# Patient Record
Sex: Female | Born: 1965 | Race: Asian | Hispanic: No | Marital: Married | Smoking: Former smoker
Health system: Southern US, Community
[De-identification: ages and names within clinical notes are randomized; demographics above are authoritative.]

## PROBLEM LIST (undated history)

## (undated) HISTORY — PX: BREAST SURGERY: SHX581

## (undated) HISTORY — PX: WISDOM TOOTH EXTRACTION: SHX21

## (undated) HISTORY — PX: FOOT SURGERY: SHX648

## (undated) HISTORY — PX: BUNIONECTOMY: SHX129

---

## 1998-11-07 ENCOUNTER — Ambulatory Visit (HOSPITAL_COMMUNITY): Admission: RE | Admit: 1998-11-07 | Discharge: 1998-11-07 | Payer: Self-pay

## 1998-11-07 ENCOUNTER — Encounter: Payer: Self-pay | Admitting: Gynecology

## 1998-12-12 ENCOUNTER — Ambulatory Visit (HOSPITAL_BASED_OUTPATIENT_CLINIC_OR_DEPARTMENT_OTHER): Admission: RE | Admit: 1998-12-12 | Discharge: 1998-12-12 | Payer: Self-pay | Admitting: Surgery

## 1999-11-20 ENCOUNTER — Encounter: Payer: Self-pay | Admitting: Gynecology

## 1999-11-20 ENCOUNTER — Ambulatory Visit (HOSPITAL_COMMUNITY): Admission: RE | Admit: 1999-11-20 | Discharge: 1999-11-20 | Payer: Self-pay | Admitting: Gynecology

## 2002-01-05 ENCOUNTER — Other Ambulatory Visit: Admission: RE | Admit: 2002-01-05 | Discharge: 2002-01-05 | Payer: Self-pay | Admitting: Gynecology

## 2003-03-01 ENCOUNTER — Encounter: Payer: Self-pay | Admitting: Gynecology

## 2003-03-01 ENCOUNTER — Encounter: Admission: RE | Admit: 2003-03-01 | Discharge: 2003-03-01 | Payer: Self-pay | Admitting: Gynecology

## 2004-10-29 ENCOUNTER — Other Ambulatory Visit: Admission: RE | Admit: 2004-10-29 | Discharge: 2004-10-29 | Payer: Self-pay | Admitting: Gynecology

## 2005-02-25 ENCOUNTER — Ambulatory Visit (HOSPITAL_COMMUNITY): Admission: RE | Admit: 2005-02-25 | Discharge: 2005-02-25 | Payer: Self-pay | Admitting: Gynecology

## 2005-02-25 ENCOUNTER — Encounter (INDEPENDENT_AMBULATORY_CARE_PROVIDER_SITE_OTHER): Payer: Self-pay | Admitting: Specialist

## 2005-02-25 ENCOUNTER — Ambulatory Visit (HOSPITAL_BASED_OUTPATIENT_CLINIC_OR_DEPARTMENT_OTHER): Admission: RE | Admit: 2005-02-25 | Discharge: 2005-02-25 | Payer: Self-pay | Admitting: Gynecology

## 2005-11-26 ENCOUNTER — Other Ambulatory Visit: Admission: RE | Admit: 2005-11-26 | Discharge: 2005-11-26 | Payer: Self-pay | Admitting: Gynecology

## 2005-12-10 ENCOUNTER — Encounter: Admission: RE | Admit: 2005-12-10 | Discharge: 2005-12-10 | Payer: Self-pay | Admitting: Gynecology

## 2006-01-31 ENCOUNTER — Ambulatory Visit (HOSPITAL_COMMUNITY): Admission: RE | Admit: 2006-01-31 | Discharge: 2006-01-31 | Payer: Self-pay | Admitting: Allergy

## 2006-04-21 ENCOUNTER — Emergency Department (HOSPITAL_COMMUNITY): Admission: EM | Admit: 2006-04-21 | Discharge: 2006-04-21 | Payer: Self-pay | Admitting: *Deleted

## 2006-04-23 ENCOUNTER — Emergency Department (HOSPITAL_COMMUNITY): Admission: EM | Admit: 2006-04-23 | Discharge: 2006-04-23 | Payer: Self-pay | Admitting: Emergency Medicine

## 2006-04-27 ENCOUNTER — Ambulatory Visit (HOSPITAL_COMMUNITY): Admission: RE | Admit: 2006-04-27 | Discharge: 2006-04-27 | Payer: Self-pay | Admitting: *Deleted

## 2007-02-10 ENCOUNTER — Other Ambulatory Visit: Admission: RE | Admit: 2007-02-10 | Discharge: 2007-02-10 | Payer: Self-pay | Admitting: Gynecology

## 2007-07-07 ENCOUNTER — Encounter: Admission: RE | Admit: 2007-07-07 | Discharge: 2007-07-07 | Payer: Self-pay | Admitting: Gynecology

## 2007-10-12 HISTORY — PX: ABDOMINAL HYSTERECTOMY: SHX81

## 2008-05-07 ENCOUNTER — Encounter: Payer: Self-pay | Admitting: Gynecology

## 2008-05-07 ENCOUNTER — Ambulatory Visit (HOSPITAL_BASED_OUTPATIENT_CLINIC_OR_DEPARTMENT_OTHER): Admission: RE | Admit: 2008-05-07 | Discharge: 2008-05-07 | Payer: Self-pay | Admitting: Gynecology

## 2008-06-28 ENCOUNTER — Ambulatory Visit: Payer: Self-pay | Admitting: Gynecology

## 2008-08-08 ENCOUNTER — Ambulatory Visit: Payer: Self-pay | Admitting: Gynecology

## 2008-08-08 ENCOUNTER — Encounter: Payer: Self-pay | Admitting: Gynecology

## 2008-08-08 ENCOUNTER — Other Ambulatory Visit: Admission: RE | Admit: 2008-08-08 | Discharge: 2008-08-08 | Payer: Self-pay | Admitting: Gynecology

## 2008-08-16 ENCOUNTER — Encounter: Admission: RE | Admit: 2008-08-16 | Discharge: 2008-08-16 | Payer: Self-pay | Admitting: Gynecology

## 2009-08-22 ENCOUNTER — Encounter: Payer: Self-pay | Admitting: Gynecology

## 2009-08-22 ENCOUNTER — Ambulatory Visit: Payer: Self-pay | Admitting: Gynecology

## 2009-08-22 ENCOUNTER — Other Ambulatory Visit: Admission: RE | Admit: 2009-08-22 | Discharge: 2009-08-22 | Payer: Self-pay | Admitting: Gynecology

## 2009-09-12 ENCOUNTER — Encounter: Admission: RE | Admit: 2009-09-12 | Discharge: 2009-09-12 | Payer: Self-pay | Admitting: Gynecology

## 2010-09-18 ENCOUNTER — Other Ambulatory Visit
Admission: RE | Admit: 2010-09-18 | Discharge: 2010-09-18 | Payer: Self-pay | Source: Home / Self Care | Admitting: Gynecology

## 2010-09-18 ENCOUNTER — Ambulatory Visit: Payer: Self-pay | Admitting: Gynecology

## 2010-09-25 ENCOUNTER — Ambulatory Visit: Payer: Self-pay | Admitting: Gynecology

## 2010-10-07 ENCOUNTER — Encounter
Admission: RE | Admit: 2010-10-07 | Discharge: 2010-10-07 | Payer: Self-pay | Source: Home / Self Care | Attending: Gynecology | Admitting: Gynecology

## 2011-02-23 NOTE — H&P (Signed)
NAME:  Stacey Wagner, Stacey Wagner                 ACCOUNT NO.:  [id number]   MEDICAL RECORD NO.:  [id number]          PATIENT TYPE:  AMB   LOCATION:  [facility]                         FACILITY:  [facility]   PHYSICIAN:  Timothy P. Fontaine, M.D.DATE OF BIRTH:  [birth year]   DATE OF ADMISSION:  DATE OF DISCHARGE:                              HISTORY & PHYSICAL   The patient is being admitted to [facility] May 07, 2008, 7:30 a.m.   CHIEF COMPLAINT:  Worsening menorrhagia, dysmenorrhea, leiomyoma.   HISTORY OF PRESENT ILLNESS:  A 45 year old G2, P2 female with withdraw  of birth control, worsening dysmenorrhea, menorrhagia, known myomas  which seem to be enlarging on ultrasound now measuring 61 mm displacing  the endometrial cavity and a smaller 36 mm myoma.  She is having bleed-  through episodes, cannot leave the house and does have a history of a  prior hysteroscopic myomectomy with initial relief, but now returning of  her symptoms.  The  recent sonohystogram did not show any intracavitary  abnormalities.  Options for management were reviewed with the patient  and ultimately has decided to proceed with hysterectomy.   PAST MEDICAL HISTORY:  Uncomplicated.   PAST SURGICAL HISTORY:  1. Includes bunion surgery.  2. Breast biopsy.  3. Hysteroscopic myomectomy.   CURRENT MEDICATIONS:  Allegra.   ALLERGIES:  KEFLEX and PENICILLIN lead to rash.   REVIEW OF SYSTEMS:  Noncontributory.   FAMILY HISTORY:  Noncontributory.   SOCIAL HISTORY:  Noncontributory.   PHYSICAL EXAMINATION:  VITAL SIGNS:  Stable.  Afebrile.  HEENT:  Normal.  LUNGS:  Clear.  CARDIAC:  Regular rate without rubs, murmurs or gallops.  ABDOMEN:  Benign.  PELVIC:  External BUS and vagina normal.  Cervix normal.  Uterus  globoid, retroverted.  Adnexa without gross masses or tenderness.   ASSESSMENT:  A 45 year old G2, P2 female withdraw of birth control,  worsening menorrhagia, dysmenorrhea, bleed-through episodes,  cannot  leave the house with enlarging myomas for LAVH.  Options for management  and alternatives were reviewed with her to include attempts at hormonal  suppression, myomectomy, uterine artery embolization, hysterectomy.  I  do not think ablation would be a good choice given the distortion from  the myoma.  She had trials of hormonal manipulation previously, but had  problems with migraines with trials of hormone.  After a lengthy  discussion, the patient wants to proceed with hysterectomy.  I reviewed  the absolute irreversible sterility associated with hysterectomy of  which she understands and accepts.  I reviewed ovarian conservation with  her and the options of keeping both ovaries for continued hormone  production, but also keeping the risk of benign ovarian disease  requiring reoperation, pain, problems in the future as well as the risk  of ovarian cancer versus removing her ovaries, the issues of  hypoestrogenism and the issues with hormone replacement therapy, WHI  study, possible increased risk of breast cancer, stroke, heart attack,  DVT issues.   The patient wants to keep both ovaries, although she does give me  permission to remove one or both  ovaries if significant disease is  encountered at the time of the surgery.  She understands by keeping both  ovaries that she is at risk for ovarian cancer and benign ovarian  disease in the future.  Sexuality following hysterectomy was also  reviewed.  The potential for persistent dyspareunia as well as orgasmic  dysfunction following the hysterectomy was discussed, understood and  accepted.  The acute intraoperative and  postoperative risks were  reviewed with the patient to include the issues of anesthetic risks,  perioperative complications as well as risk of infection either internal  requiring prolonged antibiotics, abscess formation or hematoma formation  requiring reoperation, abscess/hematoma drainage, wound complications   requiring opening and draining of incisions, closure by secondary  intention.  The patient understands at any time during the procedure, I  may convert the laparoscopic approach to an abdominal approach with a  larger incision.  If either complications arise or it is felt unsafe to  proceed with the vaginal approach, she understands and accepts this.  The long-term issues as far as cosmetics as well as hernia formation  with incision sites were also discussed.  The risk of hemorrhage  necessitating transfusion.  The risks of transfusion were reviewed to  include transfusion reaction, hepatitis, HIV, mad cow disease and other  unknown entities.  The patient initially had thought about autologous  blood, but decided against this and accepts the risks of transfusion.  The risks of acute injury to internal organs including bowel, bladder,  ureters, vessels and nerves immediately recognized, delay recognized  necessitating major exploratory reparative surgeries, future reparative  surgeries, ostomy formation, bowel resection, bladder repair, ureteral  damage repair was all discussed, understood and accepted.  The patient's  questions were answered to her satisfaction and she is ready to proceed  with surgery.      Timothy P. Fontaine, M.D.  Electronically Signed     TPF/MEDQ  D:  04/19/2008  T:  04/19/2008  Job:  [id number]

## 2011-02-23 NOTE — Op Note (Signed)
NAME:  Stacey Wagner, Stacey Wagner                 ACCOUNT NO.:  [id number]   MEDICAL RECORD NO.:  [id number]          PATIENT TYPE:  AMB   LOCATION:  [facility]                         FACILITY:  [facility]   PHYSICIAN:  Timothy P. Fontaine, M.D.DATE OF BIRTH:  03/21/1966   DATE OF PROCEDURE:  05/07/2008  DATE OF DISCHARGE:                               OPERATIVE REPORT   PREOPERATIVE DIAGNOSES:  Increasing menorrhagia, dysmenorrhea,  leiomyoma.   POSTOPERATIVE DIAGNOSES:  Increasing menorrhagia, dysmenorrhea,  leiomyoma.   PROCEDURE:  Laparoscopic-assisted vaginal hysterectomy.   SURGEON:  Timothy P. Fontaine, M.D.   ASSISTANTGaetano Hawthorne. Lily Peer, M.D.   ANESTHETIC:  General, with 2% Marcaine incisional injection.   SPECIMEN:  Uterus to pathology.   COMPLICATIONS:  None.   ESTIMATED BLOOD LOSS:  75 mL.   INTRAOPERATIVE URINE OUTPUT:  100 mL.   FINDINGS:  EUA:  External BUS, vagina normal.  Cervix grossly normal.  Uterus globoid, mildly enlarged, midline, and mobile.  Adnexa without  gross masses.  Laparoscopic:  Anterior cul-de-sac normal.  Posterior cul-  de-sac normal.  Uterus enlarged, globoid, consistent with leiomyoma.  Right and left ovaries grossly normal.  Right and left fallopian tubes  grossly normal, free and mobile.  No evidence of pelvic adhesive disease  or endometriosis.  Upper abdominal exam is grossly normal.  Liver  smooth, no abnormalities.  Gallbladder not visualized.  Appendix not  visualized.   PROCEDURE:  The patient was taken to the operating room, underwent  general anesthesia, and was placed low dorsal lithotomy position.  Received an abdominal, perineal, and vaginal preparation with Betadine  solution.  Bladder emptied with indwelling Foley catheterization, placed  in sterile technique.  EUA performed.  Hulka tenaculum placed in the  cervix.  The patient was draped in the usual fashion.  A vertical  infraumbilical incision was made.  The 10 mm OptiVu type  laparoscopic  trocar was placed under direct visualization without difficulty, and the  abdomen was insufflated.  Examination of the pelvic organs, upper  abdominal exam was carried out, with findings noted above.  Right and  left 5 mm suprapubic ports were then placed under direct visualization  after transillumination of the vessels without difficulty.  Using the  harmonic scalpel, the right pelvic adnexa was examined, the sidewall  inspected, the ureter found to be away from the operative site, and the  right uteroovarian pedicle was then transected using the harmonic  scalpel without difficulty.  The remaining broad ligament and ultimately  the round ligament was transected using the harmonic scalpel, and the  anterior vesicouterine peritoneal fold was then transected to the  midline, again using the harmonic scalpel.  A similar procedure was then  carried out on the other side.  Attention was then turned to the vaginal  portion of the case.  The patient was placed in the high dorsal  lithotomy position.  Proper placement was checked.  The Hulka tenaculum  was removed.  Cervix visualized with a weighted speculum, grasped with a  tenaculum, and the cervical mucosa was circumferentially injected using  1% lidocaine with  epinephrine mixture, a total of 9 mL.  The cervical  mucosa was then sharply incised circumferentially, and the paracervical  planes were sharply developed.  The posterior cul-de-sac was then  sharply entered.  A long weighted speculum was placed.  The right  uterosacral ligament was identified, clamped, cut, and ligated using 0  Vicryl suture and tagged for future reference.  A similar procedure was  carried out on the other side.  The anterior vesicouterine plane was  then sharply developed, and ultimately the anterior cul-de-sac entered.  The uterus was then progressively freed from its attachments through  clamping, cutting, and ligating of the parametrial cardinal  ligaments,  and ultimately the uterine vessels, all using 0 Vicryl suture.  Due to  the bulk of the uterus, it was determined that morcellation was most  prudent, and at this point the cervix was circumferentially excised from  the uterus, and the uterus was progressively morcellated to ultimately  delivered through the vagina.  All remaining parametrial attachments  were clamped, cut, and ligated using 0 Vicryl suture, and the uterus was  completely removed.  The long weighted speculum was replaced with a  shorter weighted speculum, posterior vaginal cuff grasped with an Allis  clamp.  A moist tail sponge placed to visualize the posterior cul-de-  sac.  The pelvis irrigated, and adequate hemostasis was visualized.  The  posterior vaginal cuff was then run from uterosacral ligament to  uterosacral ligament using 0 Vicryl suture in a running-interlocking  stitch.  The tail sponge was removed, and the vagina was then closed  anterior to posterior using 0 Vicryl suture in interrupted figure-of-  eight stitch.  The vagina was irrigated.  Hemostasis was visualized.  Clear yellow urine was noted.  At this point, attention was then  returned to the abdominal portion.  The patient was placed in low dorsal  lithotomy position.  The surgeon, assistant, and scrub regloved, and the  abdomen was reinsufflated.  The abdomen was copiously irrigated.  The  gas was slowly allowed to escape.  Several small bleeding points along  the vesicovaginal site were bipolar cauterized, and ultimately the  suprapubic ports were removed under direct visualization, showing  adequate hemostasis.  The infraumbilical port was then backed out under  direct visualization, showing adequate hemostasis.  No evidence of  hernia formation.  Two percent Marcaine was injected at all incision  sites.  0 Vicryl subcutaneous fascial stitch was placed  infraumbilically, and all skin incisions closed using 4-0 plain suture  in  interrupted cuticular stitch.  Sterile dressings were applied.  The  patient was placed in the supine position, awakened without difficulty,  and taken to the recovery room in good condition, having tolerated the  procedure well.      Timothy P. Fontaine, M.D.  Electronically Signed     TPF/MEDQ  D:  05/07/2008  T:  05/07/2008  Job:  [id number]

## 2011-02-26 NOTE — H&P (Signed)
NAME:  Stacey Wagner, Stacey Wagner                 ACCOUNT NO.:  [id number]   MEDICAL RECORD NO.:  [id number]          PATIENT TYPE:  AMB   LOCATION:  [facility]                         FACILITY:  [facility]   PHYSICIAN:  Timothy P. Fontaine, M.D.DATE OF BIRTH:  [birth year]   DATE OF ADMISSION:  DATE OF DISCHARGE:                                HISTORY & PHYSICAL   DATE OF SURGERY:  Feb 25, 2005 at 7:30 at [facility].   CHIEF COMPLAINT:  Menorrhagia.   HISTORY OF PRESENT ILLNESS:  A 45 year old G2, P2 female, barrier  contraception with history of increasing menorrhagia. Outpatient evaluation  included normal thyroid function and normal hemoglobin of 13.  Sonohysterogram was performed. The patient was found to have a 4.5 to 5 cm  submucosal myoma with protrusion of 1/3 to 1/2 of the myoma into the cavity.  The patient is admitted at this time for hysteroscopic evaluation and  resection.   PAST MEDICAL HISTORY:  Uncomplicated.   PAST SURGICAL HISTORY:  Includes foot surgery, breast biopsy, bunion  surgery.   ALLERGIES:  PENICILLIN.   REVIEW OF SYSTEMS:  Noncontributory.   FAMILY HISTORY:  Noncontributory.   CURRENT MEDICATIONS:  None.   PHYSICAL EXAMINATION:  VITAL SIGNS:  Stable vital signs. Afebrile.  HEENT:  Normal.  LUNGS:  Clear.  CARDIAC:  Regular rate. No murmur, rub, or gallop.  ABDOMEN:  Examination benign.  PELVIC:  External BUS vagina normal. Cervix grossly normal. Uterus overall  normal in size. Midline immobile. Adnexa without masses or tenderness.   ASSESSMENT/PLAN:  A 45 year old G2, P2 female, increasing menorrhagia,  becoming life altering to her. Ultrasound shows 2 myomas, one intramural,  measuring 39 mm and 1 submucosal, measuring 45 to 50 mm. Approximately 1/2  to 1/3 of the myoma protrudes into the cavity. I reviewed with the patient  the situations and this issues and options to include observation,  hysteroscopic resection, suppression, i.e. oral  contraceptives Depo-Lupron,  Depo-Provera, and ultimately hysterectomy. I discussed with her what is  involved with the hysteroscopic approach to include the expected  intraoperative, postoperative courses and the use of the hysteroscope. She  understands that I can only resect that portion of the myoma that protrudes  into the cavity and there is a good chance that I will not be able to remove  all of the myoma but hopefully, if we resect some of it, this may help her  with her menorrhagia. The risks of infection, bleeding, transfusion, uterine  perforation, damage to internal organs including bowel, bladder, ureters,  vessels and nerves and necessitating major exploratory reparative surgeries  and future reparative surgeries including ostomy formation was all  discussed, understood and accepted. I also reviewed the issues of distended  media absorption and the risks of metabolic complications including coma,  seizures. She understands that we may need to abort the case if too much  absorption occurs during the procedure and she understands and accepts this.  Again, she has a realistic understanding that we may not be able to resect  all of it and in fact, probably will  not be able to resect all of it but  hopefully resecting portions of it will help with her menorrhagia. The  patient questions are answered to her satisfaction and she is ready to  proceed with surgery.      TPF/MEDQ  D:  02/22/2005  T:  02/22/2005  Job:  [id number]

## 2011-02-26 NOTE — Op Note (Signed)
NAME:  BETTEY, MURAOKA                 ACCOUNT NO.:  [id number]   MEDICAL RECORD NO.:  [id number]          PATIENT TYPE:  AMB   LOCATION:  [facility]                         FACILITY:  [facility]   PHYSICIAN:  Timothy P. Fontaine, M.D.DATE OF BIRTH:  05-26-1966   DATE OF PROCEDURE:  02/25/2005  DATE OF DISCHARGE:                                 OPERATIVE REPORT   PREOPERATIVE DIAGNOSES:  Submucous myoma.   POSTOPERATIVE DIAGNOSES:  Submucous myoma.   PROCEDURE:  Hysteroscopic submucous myomectomy.   SURGEON:  Timothy P. Fontaine, M.D.   ANESTHETIC:  General.   ESTIMATED BLOOD LOSS:  Minimal.   COMPLICATIONS:  None.   SORBITOL DISCREPANCY:  295 mL.   SPECIMEN:  Endometrial curetting, myoma fragments.   FINDINGS:  Large anterior submucous myoma estimate 90% removed, cavity  otherwise grossly normal. Tubal ostia not visualized, lower uterine segment  endocervical canal all visualized and normal.   DESCRIPTION OF PROCEDURE:  The patient was taken to the operating room,  underwent general anesthesia and was placed in the dorsal lithotomy  position, received a perineal vaginal preparation with Betadine solution.  Bladder emptied with in and out Foley catheterization. EUA performed. The  patient was draped in the usual fashion. A weighted speculum was placed,  anterior lip of the cervix grasped with a single-tooth tenaculum and the  cervix was gently and gradually dilated to admit the operative hysteroscope.  Hysteroscopy was performed with findings noted above.  Using the right-angle  resectoscopic loop, the submucous myoma was resected through repetitive  passes noting decompression with removal of fragments allowed more of the  myoma to protrude into the cavity for greater resection. At the end of the  procedure, it was estimated a minimal amount, approximately 10%  appeared to  be remaining at the level of the surrounding endometrium and this was not  removed for fear of subsequent  perforation. Sharp curettage was then  performed, rehysteroscopy performed, good distension, no evidence of  perforation. Instruments were removed. Initial bleeding from tenaculum sites  were noted, silver nitrate was applied and  ultimately two 3-0 chromic interrupted sutures were placed at both tenaculum  sites to achieve ultimate hemostasis. The patient was placed in supine  position, awakened without difficulty and taken to the recovery room in good  condition having tolerated the procedure well.      TPF/MEDQ  D:  02/25/2005  T:  02/25/2005  Job:  [id number]

## 2011-02-26 NOTE — Discharge Summary (Signed)
NAME:  VANGIE, HENTHORN                 ACCOUNT NO.:  [id number]   MEDICAL RECORD NO.:  [id number]          PATIENT TYPE:  AMB   LOCATION:  [facility]                         FACILITY:  [facility]   PHYSICIAN:  Timothy P. Fontaine, M.D.DATE OF BIRTH:  09/25/1966   DATE OF ADMISSION:  05/07/2008  DATE OF DISCHARGE:  05/08/2008                               DISCHARGE SUMMARY   DISCHARGE DIAGNOSES:  1. Dysmenorrhea.  2. Menorrhagia.  3. Leiomyoma.   PROCEDURE:  LAVH May 07, 2008, pathology pending.   HOSPITAL COURSE:  A 45 year old underwent uncomplicated LAVH May 07, 2008 with findings of leiomyoma.  The patient's postoperative course was  uncomplicated.  She was discharged on postoperative day number 1  ambulating well, tolerating a regular diet, voiding without difficulty  with postoperative hemoglobin of 10.5.  The patient did have itching  when taking oral Tylox and was switched to Dilaudid 2 mg tablets and did  well with this, and was sent home with a prescription of Dilaudid 2 mg  tablets, number 15 one-two p.o. q.4-6 h. p.r.n. pain.  She was given  precautions, instructions, followup and will be seen in the office 2  weeks following discharge.      Timothy P. Fontaine, M.D.  Electronically Signed     TPF/MEDQ  D:  05/08/2008  T:  05/08/2008  Job:  [id number]

## 2011-07-09 LAB — CBC
HCT: 31.6 — ABNORMAL LOW
MCV: 91.3
RBC: 3.46 — ABNORMAL LOW
RDW: 16.2 — ABNORMAL HIGH

## 2011-08-13 ENCOUNTER — Other Ambulatory Visit: Payer: Self-pay | Admitting: Family Medicine

## 2011-08-13 ENCOUNTER — Ambulatory Visit
Admission: RE | Admit: 2011-08-13 | Discharge: 2011-08-13 | Disposition: A | Discharge status: Home / Self Care | Payer: BC Managed Care – PPO | Source: Ambulatory Visit | Attending: Family Medicine | Admitting: Family Medicine

## 2011-08-13 DIAGNOSIS — E041 Nontoxic single thyroid nodule: Secondary | ICD-10-CM

## 2011-08-13 DIAGNOSIS — R221 Localized swelling, mass and lump, neck: Secondary | ICD-10-CM

## 2011-08-16 ENCOUNTER — Other Ambulatory Visit: Payer: Self-pay | Admitting: Family Medicine

## 2011-08-16 DIAGNOSIS — E041 Nontoxic single thyroid nodule: Secondary | ICD-10-CM

## 2011-08-18 ENCOUNTER — Other Ambulatory Visit (HOSPITAL_COMMUNITY)
Admission: RE | Admit: 2011-08-18 | Discharge: 2011-08-18 | Disposition: A | Discharge status: Home / Self Care | Payer: BC Managed Care – PPO | Source: Ambulatory Visit | Attending: Diagnostic Radiology | Admitting: Diagnostic Radiology

## 2011-08-18 ENCOUNTER — Ambulatory Visit
Admission: RE | Admit: 2011-08-18 | Discharge: 2011-08-18 | Disposition: A | Discharge status: Home / Self Care | Payer: BC Managed Care – PPO | Source: Ambulatory Visit | Attending: Family Medicine | Admitting: Family Medicine

## 2011-08-18 DIAGNOSIS — E041 Nontoxic single thyroid nodule: Secondary | ICD-10-CM

## 2011-08-18 DIAGNOSIS — E049 Nontoxic goiter, unspecified: Secondary | ICD-10-CM | POA: Insufficient documentation

## 2011-08-20 ENCOUNTER — Other Ambulatory Visit: Payer: Self-pay

## 2011-09-17 ENCOUNTER — Ambulatory Visit (INDEPENDENT_AMBULATORY_CARE_PROVIDER_SITE_OTHER): Payer: BC Managed Care – PPO | Admitting: Surgery

## 2011-09-17 ENCOUNTER — Encounter (INDEPENDENT_AMBULATORY_CARE_PROVIDER_SITE_OTHER): Payer: Self-pay | Admitting: Surgery

## 2011-09-17 VITALS — BP 128/84 | HR 60 | Temp 98.0°F | Resp 16 | Ht 61.5 in | Wt 140.0 lb

## 2011-09-17 DIAGNOSIS — E079 Disorder of thyroid, unspecified: Secondary | ICD-10-CM

## 2011-09-17 DIAGNOSIS — E049 Nontoxic goiter, unspecified: Secondary | ICD-10-CM

## 2011-09-17 NOTE — Patient Instructions (Signed)
Followup  if mass recurs in the thyroid lobe Would use iodinated salt--since you developed this goiter while off iodinated salt

## 2011-09-17 NOTE — Progress Notes (Signed)
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 She noticed a lump in her left neck and saw Dr. Wynelle Link. Dr. Wynelle Link ordered an ultrasound of the thyroid or a cystic structure was seen and aspirated area. Aspiration caused the mass to go away and discomfort to go away as well. The cytology of this was that of a non-neoplastic goiter.  On today's exam there is no residual mass felt.  History reviewed. No pertinent past medical history.  Past Surgical History  Procedure Date  . Breast surgery     cyst removed from left breast  . Bunionectomy 2002  . Wisdom tooth extraction   . Abdominal hysterectomy 2009    partial hysterectomy     No current outpatient prescriptions on file as of 09/17/2011.   No current facility-administered medications on file as of 09/17/2011.   Allergies  Allergen Reactions  . Keflex     rash and hives  . Penicillins     Rash and hives    History reviewed. No pertinent family history. Social History:   reports that she has quit smoking. She has never used smokeless tobacco. She reports that she drinks alcohol. She reports that she does not use illicit drugs.   REVIEW OF SYSTEMS - PERTINENT POSITIVES ONLY: She does have a son with lymphoma. She has recently been not taking iodinated salt.  Physical Exam:   Blood pressure 128/84, pulse 60, temperature 98 F (36.7 C), temperature source Temporal, resp. rate 16, height 5' 1.5" (1.562 m), weight 140 lb (63.504 kg). Body mass index is 26.02 kg/(m^2).  Gen:  No acute distress.  Well nourished and well groomed.   Neurological: Alert and oriented to person, place, and time. Coordination normal.  Head: Normocephalic and atraumatic.  Eyes: Conjunctivae are normal. Pupils are equal, round, and reactive to light. No scleral icterus.  Neck: Normal range of motion. Neck supple. No  tracheal deviation or thyromegaly present.  Cardiovascular:  SR without murmurs or gallops Respiratory: Effort normal.  No respiratory distress. No chest wall tenderness. Breath sounds normal.  No wheezes, rales or rhonchi.  GI: Soft. Bowel sounds are normal. The abdomen is soft and nontender.  There is no rebound and no guarding. GU:  No inguinal herniae Musculoskeletal: Normal range of motion. Extremities are nontender.  Lymphadenopathy: No cervical, preauricular, postauricular or axillary adenopathy is present Skin: Skin is warm and dry. No rash noted. No diaphoresis. No erythema. No pallor. No clubbing, cyanosis, or edema.  Pscyh: Normal mood and affect. Behavior is normal. Judgment and thought content normal.   LABORATORY RESULTS: No results found for this or any previous visit (from the past 48 hour(s)).  RADIOLOGY RESULTS: No results found.  Problem List: Active Problems:  * No active hospital problems. 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 I discussed management options with her and I think it's very reasonable just to watch physical exam. If she has a recurrent mass within her thyroid I would repeat her over sound and reaspirated but with a lower threshold for recommending thyroidectomy. I told her that we'll be happy to see her again if needed or she could certainly follow up with Dr. Wynelle Link as needed.    Matt B. Daphine Deutscher, MD, [facility]  [facility] [phone] beeper [phone]  09/17/2011 10:33 AM

## 2011-09-21 ENCOUNTER — Encounter (INDEPENDENT_AMBULATORY_CARE_PROVIDER_SITE_OTHER): Payer: Self-pay | Admitting: Surgery

## 2013-07-27 ENCOUNTER — Encounter: Payer: Self-pay | Admitting: Gynecology

## 2013-07-27 ENCOUNTER — Ambulatory Visit (INDEPENDENT_AMBULATORY_CARE_PROVIDER_SITE_OTHER): Payer: BC Managed Care – PPO | Admitting: Gynecology

## 2013-07-27 ENCOUNTER — Other Ambulatory Visit (HOSPITAL_COMMUNITY)
Admission: RE | Admit: 2013-07-27 | Discharge: 2013-07-27 | Disposition: A | Discharge status: Home / Self Care | Payer: BC Managed Care – PPO | Source: Ambulatory Visit | Attending: Gynecology | Admitting: Gynecology

## 2013-07-27 VITALS — BP 116/74 | Ht 62.0 in | Wt 140.0 lb

## 2013-07-27 DIAGNOSIS — Z01419 Encounter for gynecological examination (general) (routine) without abnormal findings: Secondary | ICD-10-CM | POA: Insufficient documentation

## 2013-07-27 DIAGNOSIS — R300 Dysuria: Secondary | ICD-10-CM

## 2013-07-27 DIAGNOSIS — N951 Menopausal and female climacteric states: Secondary | ICD-10-CM

## 2013-07-27 LAB — WET PREP FOR TRICH, YEAST, CLUE
Clue Cells Wet Prep HPF POC: NONE SEEN
Trich, Wet Prep: NONE SEEN
WBC, Wet Prep HPF POC: NONE SEEN

## 2013-07-27 LAB — URINALYSIS W MICROSCOPIC + REFLEX CULTURE
Bilirubin Urine: NEGATIVE
Specific Gravity, Urine: <1.005 — ABNORMAL LOW (ref 1.005–1.030)

## 2013-07-27 LAB — TSH: TSH: 1.66 u[IU]/mL (ref 0.350–4.500)

## 2013-07-27 MED ORDER — FLUCONAZOLE 150 MG PO TABS: 150.0000 mg | ORAL_TABLET | Freq: Once | ORAL | Status: DC

## 2013-07-27 NOTE — Addendum Note (Signed)
Addended by: Dayna Barker on: 07/27/2013 10:55 AM   Modules accepted: Orders

## 2013-07-27 NOTE — Progress Notes (Addendum)
Stacey Wagner 14-Dec-1965 [id number]        47 y.o.  G2P2002 for annual exam.  Several issues that are below.  Past medical history,surgical history, medications, allergies, family history and social history were all reviewed and documented in the EPIC chart.  ROS:  Performed and pertinent positives and negatives are included in the history, assessment and plan .  Exam: Kim assistant Filed Vitals:   07/27/13 0944  BP: 116/74  Height: 5\' 2"  (1.575 m)  Weight: 140 lb (63.504 kg)   General appearance  Normal Skin grossly normal Head/Neck normal with no cervical or supraclavicular adenopathy thyroid normal Lungs  clear Cardiac RR, without RMG Abdominal  soft, nontender, without masses, organomegaly or hernia Breasts  examined lying and sitting without masses, retractions, discharge or axillary adenopathy. Pelvic  Ext/BUS/vagina  slight white discharge, Pap of cuff done  Adnexa  Without masses or tenderness    Anus and perineum  normal   Rectovaginal  normal sphincter tone without palpated masses or tenderness.    Assessment/Plan:  47 y.o. [id number] female for annual exam.   1. 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 Is starting to have hot flashes and night sweats.  Notes that her mother and sister both went into menopause in her 46s. Will check TSH FSH.  I reviewed the whole issue of HRT with her to include the WHI study with increased risk of stroke, heart attack, DVT and breast cancer. The ACOG and NAMS statements for lowest dose for the shortest period of time reviewed. Transdermal versus oral first-pass effect benefit discussed. Patient will think about whether she wants to start assuming that her [facility] is elevated. Will followup for lab results. 2. Mild dysuria. Exam and wet prep consistent with yeast vaginitis. Urinalysis is negative. Will cover with Diflucan 150x1 dose. Followup if symptoms persist. 3. Pap smear 2011. No history of significant Pap smears previously. Discussed current  screening guidelines. She is status post hysterectomy for benign indications and options to stop altogether discussed. I did a Pap smear today and we'll rediscuss on an annual basis. 4. Mammography 2011. Patient knows to schedule and agrees to do so. SBE monthly reviewed. 5. Health maintenance. Patient reports routine blood work done earlier this year. Followup for lab results otherwise annually.   Note: This document was prepared with digital dictation and possible smart phrase technology. Any transcriptional errors that result from this process are unintentional.   Dara Lords MD, 10:10 AM 07/27/2013

## 2013-07-27 NOTE — Patient Instructions (Addendum)
Take Diflucan pill. Followup if the symptoms continue.  Call to Schedule your mammogram  Facilities in [place]: 1)  [facility] [place], [place] Phone: [phone] 2)  [facility], [phone] Phone: [phone] 3)  Dr. Yolanda Bonine at [facility] [address] Phone: [phone]     Mammogram A mammogram is an X-ray test to find changes in a woman's breast. You should get a mammogram if:  You are 70 years of age or older  You have risk factors.   Your doctor recommends that you have one.  BEFORE THE TEST  Do not schedule the test the week before your period, especially if your breasts are sore during this time.  On the day of your mammogram:  Wash your breasts and armpits well. After washing, do not put on any deodorant or talcum powder on until after your test.   Eat and drink as you usually do.   Take your medicines as usual.   If you are diabetic and take insulin, make sure you:   Eat before coming for your test.   Take your insulin as usual.   If you cannot keep your appointment, call before the appointment to cancel. Schedule another appointment.  TEST  You will need to undress from the waist up. You will put on a hospital gown.   Your breast will be put on the mammogram machine, and it will press firmly on your breast with a piece of plastic called a compression paddle. This will make your breast flatter so that the machine can X-ray all parts of your breast.   Both breasts will be X-rayed. Each breast will be X-rayed from above and from the side. An X-ray might need to be taken again if the picture is not good enough.   The mammogram will last about 15 to 30 minutes.  AFTER THE TEST Finding out the results of your test Ask when your test results will be ready. Make sure you get your test results.  Document Released: 12/24/2008 Document Revised:  09/16/2011 Document Reviewed: 12/24/2008 [facility] Patient Information 2012 [place], [place].

## 2013-07-28 LAB — FOLLICLE STIMULATING HORMONE: FSH: 27.7 m[IU]/mL

## 2013-08-02 ENCOUNTER — Telehealth: Payer: Self-pay | Admitting: *Deleted

## 2013-08-02 NOTE — Telephone Encounter (Signed)
Entered in error

## 2013-08-02 NOTE — Telephone Encounter (Signed)
Pt states that she has an appt for a 2nd opinion, prior to her appt to discuss with Dr Ralene Cork her 09/28/2013 surgery.  Pt states that she sent a records release and would like x-ray copies.  I said that I would inform Dr Ralene Cork and advise our media clerk of the records request.

## 2013-08-31 ENCOUNTER — Ambulatory Visit (INDEPENDENT_AMBULATORY_CARE_PROVIDER_SITE_OTHER): Payer: BC Managed Care – PPO

## 2013-08-31 VITALS — BP 138/87 | HR 64 | Resp 18

## 2013-08-31 DIAGNOSIS — M2040 Other hammer toe(s) (acquired), unspecified foot: Secondary | ICD-10-CM

## 2013-08-31 DIAGNOSIS — M79609 Pain in unspecified limb: Secondary | ICD-10-CM

## 2013-08-31 DIAGNOSIS — M2030 Hallux varus (acquired), unspecified foot: Secondary | ICD-10-CM

## 2013-08-31 DIAGNOSIS — M216X9 Other acquired deformities of unspecified foot: Secondary | ICD-10-CM

## 2013-08-31 DIAGNOSIS — M2042 Other hammer toe(s) (acquired), left foot: Secondary | ICD-10-CM

## 2013-08-31 DIAGNOSIS — M7750 Other enthesopathy of unspecified foot: Secondary | ICD-10-CM

## 2013-08-31 NOTE — Patient Instructions (Signed)
Pre-Operative Instructions  Congratulations, you have decided to take an important step to improving your quality of life.  You can be assured that the doctors of [facility] will be with you every step of the way.  1. Plan to be at the surgery center/hospital at least 1 (one) hour prior to your scheduled time unless otherwise directed by the surgical center/hospital staff.  You must have a responsible adult accompany you, remain during the surgery and drive you home.  Make sure you have directions to the surgical center/hospital and know how to get there on time. 2. For hospital based surgery you will need to obtain a history and physical form from your family physician within 1 month prior to the date of surgery- we will give you a form for you primary physician.  3. We make every effort to accommodate the date you request for surgery.  There are however, times where surgery dates or times have to be moved.  We will contact you as soon as possible if a change in schedule is required.   4. No Aspirin/Ibuprofen for one week before surgery.  If you are on aspirin, any non-steroidal anti-inflammatory medications (Mobic, Aleve, Ibuprofen) you should stop taking it 7 days prior to your surgery.  You make take Tylenol  For pain prior to surgery.  5. Medications- If you are taking daily heart and blood pressure medications, seizure, reflux, allergy, asthma, anxiety, pain or diabetes medications, make sure the surgery center/hospital is aware before the day of surgery so they may notify you which medications to take or avoid the day of surgery. 6. No food or drink after midnight the night before surgery unless directed otherwise by surgical center/hospital staff. 7. No alcoholic beverages 24 hours prior to surgery.  No smoking 24 hours prior to or 24 hours after surgery. 8. Wear loose pants or shorts- loose enough to fit over bandages, boots, and casts. 9. No slip on shoes, sneakers are best. 10. Bring  your boot with you to the surgery center/hospital.  Also bring crutches or a walker if your physician has prescribed it for you.  If you do not have this equipment, it will be provided for you after surgery. 11. If you have not been contracted by the surgery center/hospital by the day before your surgery, call to confirm the date and time of your surgery. 12. Leave-time from work may vary depending on the type of surgery you have.  Appropriate arrangements should be made prior to surgery with your employer. 13. Prescriptions will be provided immediately following surgery by your doctor.  Have these filled as soon as possible after surgery and take the medication as directed. 14. Remove nail polish on the operative foot. 15. Wash the night before surgery.  The night before surgery wash the foot and leg well with the antibacterial soap provided and water paying special attention to beneath the toenails and in between the toes.  Rinse thoroughly with water and dry well with a towel.  Perform this wash unless told not to do so by your physician.  Enclosed: 1 Ice pack (please put in freezer the night before surgery)   1 Hibiclens skin cleaner   Pre-op Instructions  If you have any questions regarding the instructions, do not hesitate to call our office.  [place]: [address] [place], [place] [address] [phone]  [place]: [address] [place], [place] [address] [phone]  [place]: [address]  [place], [place] [address] [phone]  Dr. Kejuan Bekker   Tuchman DPM, Dr. Norman Regal DPM Dr. Ebonie Westerlund DPM, Dr. M. Todd Hyatt DPM, Dr. Kathryn Egerton DPM 

## 2013-08-31 NOTE — Progress Notes (Signed)
  Subjective:    Patient ID: Stacey Wagner, female    DOB: Sep 18, 1966, 47 y.o.   MRN: [id number]  HPIleft foot has some new pain and i do get keloids and that was not taken care of at the surgery and the 5th toe underneath is hurting and burns in the heel and ball of foot Patient did go for a second opinion with Dr. Ria Bush in [place]. She's back in discussed the surgery as previously proposed second osteotomy and capsulotomy of second MTP joint as well as hallux varus repair with reverse Eliberto Ivory and tight rope type reconstruction of the lateral collateral ligaments of the first MTP joint. Surgery scheduled for December 19.  Review of Systems  Constitutional: Negative.   HENT: Negative.   Eyes: Negative.   Respiratory:       Cold   Cardiovascular: Negative.   Gastrointestinal: Negative.   Endocrine: Negative.   Genitourinary: Negative.   Musculoskeletal: Negative.   Skin: Negative.   Allergic/Immunologic: Positive for environmental allergies.  Neurological: Negative.   Hematological: Negative.   Psychiatric/Behavioral: Negative.        Objective:   Physical Exam Neurovascular status is intact. There is good flexibility of the first and second MTP joints again instability the toes with hallux varus and second toe varus are noted. Patient is having pain along the fifth MTP area likely secondary to compensatory gait changes and the fact that she's having pain in the big toe joints she is likely walking on the outside of her foot. X-rays reveal no sign of fracture or other osseous abnormality slight tailor bunion is identified although not significant. No fractures noted. At this time again discussed the procedure a reverse Austin-type osteotomy and utilizing an bone anchor and tight rope type technique to reconstruct the lateral MTP joint ligaments. Patient will be in her fracture boot for approximately a month likely ambulatory postop. 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     Assessment & Plan:  Assessment no new changes the second opinion 10 with the need for surgery to address the hallux varus and second toe capsulitis. The short and decompression osteotomy should be helpful as well as the realignment of the hallux. At this time surgery proceeded scheduled no additional questions by patient at this time. Patient is to contact me if she has any further questions or concerns. Patient was advised she still has some pain at the base of first metatarsal which is most likely some mild arthropathy below that fusion site is successful. I advised her not all her pain will be resolved with the surgery. Some arthritic pain in symptomology may remain despite surgical correction. She seemed understand that after explaining.  Alvan Dame DPM

## 2013-09-21 ENCOUNTER — Encounter: Payer: Self-pay | Admitting: *Deleted

## 2013-09-24 ENCOUNTER — Telehealth: Payer: Self-pay | Admitting: *Deleted

## 2013-09-24 NOTE — Telephone Encounter (Signed)
Pt asked if should bring the old boot to the surgery center, and if she could discuss possible procedure techniques with Dr Ralene Cork before they anesthestized her.  I told pt to bring the boot, in case she could use it, and to schedule an appt to discuss her procedure concerns and change her post-op appt.  Transferred to scheduler.

## 2013-09-26 ENCOUNTER — Ambulatory Visit (INDEPENDENT_AMBULATORY_CARE_PROVIDER_SITE_OTHER): Payer: BC Managed Care – PPO

## 2013-09-26 VITALS — BP 130/84 | HR 60 | Resp 12

## 2013-09-26 DIAGNOSIS — M2042 Other hammer toe(s) (acquired), left foot: Secondary | ICD-10-CM

## 2013-09-26 DIAGNOSIS — M7750 Other enthesopathy of unspecified foot: Secondary | ICD-10-CM

## 2013-09-26 DIAGNOSIS — M2030 Hallux varus (acquired), unspecified foot: Secondary | ICD-10-CM

## 2013-09-26 DIAGNOSIS — M2040 Other hammer toe(s) (acquired), unspecified foot: Secondary | ICD-10-CM

## 2013-09-26 NOTE — Progress Notes (Signed)
   Subjective:    Patient ID: Stacey Wagner, female    DOB: 06-06-1966, 47 y.o.   MRN: [id number]  HPI Comments: LT FOOT PAIN ( SURGERY CONSULT)  Foot Pain The current episode started more than 1 month ago. The problem occurs constantly. The symptoms are aggravated by walking and standing. She has tried nothing for the symptoms. The treatment provided no relief.      Review of Systems  All other systems reviewed and are negative.       Objective:   Physical Exam Neurovascular status unremarkable patient is here with additional questions bilateral posterior to for this coming Friday. Patient did have hallux varus which needs to be repaired she scheduled for a reverse Austin and reconstruction of lateral collateral ligaments utilizing a Mytec or bone anchor system. Patient also second metatarsal osteotomy for a long second met with plantarflexed metatarsal. We did discuss the postoperative course all questions asked by the patient this time are answered. Patient is anxious about having to have a repeat surgery which is certainly understandable.       Assessment & Plan:  Patient has hallux varus as well as a capsulitis second MTP area with digital contracture. Is on left foot patient has additional preoperative questions concerning the proposed surgery and postop course we discussed these in detail the consent for was again reviewed. Surgery scheduled for this Friday at the [facility] specialty surgical center. Plan is for [facility] bunionectomy are osteotomy reversed with ligament repair reconstruction utilizing bone anchor system of left great toe joint . Loss of the second met osteotomy in for correction of contracted second toe. Patient seem to be pleased with the discussion to be more comfortable with the proposed procedures at this time and is ready to proceed will be seen at surgery center on this coming Friday.  Alvan Dame DPM

## 2013-09-28 DIAGNOSIS — M779 Enthesopathy, unspecified: Secondary | ICD-10-CM

## 2013-09-28 DIAGNOSIS — M21549 Acquired clubfoot, unspecified foot: Secondary | ICD-10-CM

## 2013-09-28 DIAGNOSIS — M19079 Primary osteoarthritis, unspecified ankle and foot: Secondary | ICD-10-CM

## 2013-09-28 DIAGNOSIS — M2010 Hallux valgus (acquired), unspecified foot: Secondary | ICD-10-CM

## 2013-10-01 ENCOUNTER — Telehealth: Payer: Self-pay | Admitting: *Deleted

## 2013-10-01 MED ORDER — HYDROCODONE-ACETAMINOPHEN 10-325 MG PO TABS: 1.0000 | ORAL_TABLET | Freq: Four times a day (QID) | ORAL | Status: DC | PRN

## 2013-10-01 NOTE — Telephone Encounter (Addendum)
Pt states she has questions about her pain medicine.  Pt states she is taking Oxycodone and Clindamycin.  She is itching all over.  Pt has 3 Clindamycin left.  I instructed her to stop both, I would advise Dr Ralene Cork and call again.  I told the pt I would get another doctor tor prescribe a different pain medication, if the felt it was necessary.  Dr Charlsie Merles ordered Hydrocodone 10/325 #30 on tablet po every 6 hous prn foot pain.  I informed the pt and informed she would have to pick the rx up at the [facility] office.  Pt's son will pick up.

## 2013-10-05 ENCOUNTER — Ambulatory Visit: Payer: Self-pay

## 2013-10-08 ENCOUNTER — Ambulatory Visit: Payer: Self-pay | Admitting: Podiatry

## 2013-10-09 ENCOUNTER — Ambulatory Visit (INDEPENDENT_AMBULATORY_CARE_PROVIDER_SITE_OTHER): Payer: BC Managed Care – PPO

## 2013-10-09 ENCOUNTER — Ambulatory Visit: Payer: Self-pay

## 2013-10-09 DIAGNOSIS — Z9889 Other specified postprocedural states: Secondary | ICD-10-CM

## 2013-10-09 DIAGNOSIS — M2040 Other hammer toe(s) (acquired), unspecified foot: Secondary | ICD-10-CM

## 2013-10-09 DIAGNOSIS — M2042 Other hammer toe(s) (acquired), left foot: Secondary | ICD-10-CM

## 2013-10-09 DIAGNOSIS — M7750 Other enthesopathy of unspecified foot: Secondary | ICD-10-CM

## 2013-10-09 DIAGNOSIS — M2030 Hallux varus (acquired), unspecified foot: Secondary | ICD-10-CM

## 2013-10-09 NOTE — Progress Notes (Signed)
   Subjective:    Patient ID: Stacey Wagner, female    DOB: 1965/11/16, 47 y.o.   MRN: [id number]  HPI patient presents for postop visit 1 week status post hallux varus repair and repair of capsulotomy long second metatarsal both of left foot.   Review of Systems no new changes or findings at this time.     Objective:   Physical Exam Neurovascular status is intact pedal pulses palpable bilateral. There is been minimal pain or discomfort sticky some Advil or ibuprofen for pain during the day and the Vicodin at nighttime only. Is ambulating in the air fracture boot as instructed. X-rays taken reveal good alignment of the hallux clinically the hallux is rectus as the second digit on x-ray patient is undergone reversal Austin as well as placement of a bone anchor to the phalanx of the left hallux to recreate lateral collateral ligaments. With dressing of the digits appears to be stable and firm no significant varus was identified at this time. The hallux appears to be rectus as of the second digit. Osteotomies with intact fixation noted first and second metatarsals. Mild postoperative edema and ecchymosis consistent with postop course.       Assessment & Plan:  Assessment good postop progress no dehiscence no signs of infection. Minimal edema discomfort. Dry sterile dressing reapplied reappointed one week for dressing change at that time possible suture removal as needed patient will maintain moderate walking ambulation maintain air fracture boot for at least is 6 week duration. Did advise is some slight active passive range of motion of the hallux with a dressings intact. Maintain ice and elevation possible. Reappointed one week for postop followup  Alvan Dame DPM

## 2013-10-09 NOTE — Patient Instructions (Signed)

## 2013-10-16 NOTE — Progress Notes (Signed)
1) Reverse austin osteotomy with placement of bone anchor left foot  2) Metatarsal osteotomy 2nd met left foot

## 2013-10-17 ENCOUNTER — Ambulatory Visit (INDEPENDENT_AMBULATORY_CARE_PROVIDER_SITE_OTHER): Payer: BC Managed Care – PPO

## 2013-10-17 VITALS — BP 123/75 | HR 57 | Resp 12

## 2013-10-17 DIAGNOSIS — M779 Enthesopathy, unspecified: Secondary | ICD-10-CM

## 2013-10-17 DIAGNOSIS — M7750 Other enthesopathy of unspecified foot: Secondary | ICD-10-CM

## 2013-10-17 DIAGNOSIS — M2030 Hallux varus (acquired), unspecified foot: Secondary | ICD-10-CM

## 2013-10-17 DIAGNOSIS — M778 Other enthesopathies, not elsewhere classified: Secondary | ICD-10-CM

## 2013-10-17 DIAGNOSIS — Z9889 Other specified postprocedural states: Secondary | ICD-10-CM

## 2013-10-17 NOTE — Patient Instructions (Signed)

## 2013-10-17 NOTE — Progress Notes (Signed)
   Subjective:    Patient ID: Stacey Wagner, female    DOB: 12/17/1965, 48 y.o.   MRN: [id number] "It gets sore by the end of the day but I'm back at work.  My ankle looks swollen."  HPI    Review of Systems no new changes or findings at this time.     Objective:   Physical Exam Patient is approximately 2 and half weeks status post hallux varus repair left foot. As well as capsulotomy second MTP area left foot. Incision clean dry well coapted may be slight blistering of the base of the hallux proximal lateral hallux space could be an irritation from swelling and tightening of the dressing. Dressings are removed at this time Neosporin and a gauze wrap applied and dispensed anklet to maintain compression of the foot. Also dispensed and applied some Coflex wrap in a one-inch Coflex to buddy splint the toes one through 4 on the left foot. The hallux is a good rectus position good range of motion dorsiflexion plantar flexion mild edema and ecchymosis consistent with postop course. The wound appears well coapted no discharge or drainage noted.       Assessment & Plan:  Assessment good postop progress continue with compression wrapping moderate activities maintain air fracture boot for 3 more weeks reappointed 3 weeks for followup x-rays may likely return to come for walking or tendon shoes thereafter. Also dispensed authorization for handicap parking plaque for 6 months. Improving following hallux varus repair and capsulotomy second MTP area left foot.  Alvan Dameichard Atlas Crossland DPM

## 2013-11-16 ENCOUNTER — Ambulatory Visit (INDEPENDENT_AMBULATORY_CARE_PROVIDER_SITE_OTHER): Payer: BC Managed Care – PPO

## 2013-11-16 VITALS — BP 145/87 | HR 70 | Resp 16

## 2013-11-16 DIAGNOSIS — Z9889 Other specified postprocedural states: Secondary | ICD-10-CM

## 2013-11-16 DIAGNOSIS — R52 Pain, unspecified: Secondary | ICD-10-CM

## 2013-11-16 DIAGNOSIS — M2030 Hallux varus (acquired), unspecified foot: Secondary | ICD-10-CM

## 2013-11-16 DIAGNOSIS — M79609 Pain in unspecified limb: Secondary | ICD-10-CM

## 2013-11-16 NOTE — Progress Notes (Signed)
   Subjective:    Patient ID: Stacey Wagner, female    DOB: 05/24/1966, 48 y.o.   MRN: [id number]  HPI Comments: "Its still hurting"     Review of Systems no new changes or findings     Objective:   Physical Exam Neurovascular status is intact there is some mild edema noted patient still some mild paresthesias over the dorsal incision area and first MTP area consistent with postop course may be some mild neuritis or neuralgia secondary to multiple incision and multiple surgeries. Suggested topical ointment such as a salon pas or icy hot or something somewhat that. Continued active passive range of motion exercises and Coflex wrap in the buddy wrap in the toes. X-rays reveal good clinical and radiographic alignment intact fixations good clinical alignment of the hallux MTP joint and second toe joint as well.       Assessment & Plan:  Assessment good postop progress improvement noted continue with range of motion exercises suggest a two-month long-term followup with x-rays may discontinue boot and resume comfortable walking tennis or athletic shoes with a firm sole.  Alvan Dameichard Jarvis Knodel DPM

## 2013-11-16 NOTE — Patient Instructions (Signed)

## 2013-12-05 ENCOUNTER — Ambulatory Visit (INDEPENDENT_AMBULATORY_CARE_PROVIDER_SITE_OTHER): Payer: BC Managed Care – PPO

## 2013-12-05 ENCOUNTER — Ambulatory Visit: Payer: BC Managed Care – PPO

## 2013-12-05 VITALS — BP 122/64 | HR 64 | Resp 12

## 2013-12-05 DIAGNOSIS — Z9889 Other specified postprocedural states: Secondary | ICD-10-CM

## 2013-12-05 DIAGNOSIS — R52 Pain, unspecified: Secondary | ICD-10-CM

## 2013-12-05 DIAGNOSIS — R609 Edema, unspecified: Secondary | ICD-10-CM

## 2013-12-05 MED ORDER — PREDNISONE 10 MG PO KIT: PACK | ORAL | Status: DC

## 2013-12-05 NOTE — Patient Instructions (Signed)

## 2013-12-05 NOTE — Progress Notes (Signed)
   Subjective:    Patient ID: Stacey Wagner, female    DOB: 1965-11-13, 48 y.o.   MRN: [id number]  HPI patient is 10 weeks status post hallux varus repair as well as second met osteotomy and hammertoe repair second toe left foot. Patient to DC the buddy wrap of the hallux second and third digits the clinically and radiographically the alignment of the osteotomies and fixations. The well aligned the hallux appears to be relatively rectus with good range of motion dorsiflexion plantar flexion her patient significant +2 edema over the dorsum of the foot over the second metatarsal neck and shaft her we did shortening osteotomy. A she's been back to work with her for the dependent position possibly lead to some additional swelling she was able to wear her boot hour she tried wearing her tennis shoes or sneakers became extremely usually painful and sharp shooting pain and posse some slight paresthesia to the edema. There is no significant ecchymosis clinically and radiographically alignment looks good with consolidation of the osteotomies occurring. Consider a postoperative edema and scar tissue still present    Review of Systems no new changes or findings     Objective:   Physical Exam Neurovascular status is intact with pedal pulses palpable DP and PT +2/4 bilateral. Patient significant edema of the midfoot forefoot of left foot status post surgery she is 10 weeks postop. Also swelling as well as second metatarsal second or space area. Pain with activities ambulation walking in particular with a tennis shoe or athletic shoe. Patient describing paresthesia burning and shooting type pain. X-rays reveal good alignment of the osteotomies and fixations clinically the digits. Well aligned and rectus with good range of motion no increased temperature no ascending lymphangitis or no dehiscence noted.      Assessment & Plan:  Assessment slow postoperative progress with continued pain slight paresthesia secondary  to likely edema and soft tissue swelling around the second metatarsal second interspace area. Patient placed in a Darco shoe at this time as alternative to going back to the air fracture boot advised to continue with active passive range of motion exercises and ice application to the dorsum of the foot daily. Also will initiate patient on a Sterapred DS Dosepak x6 days. This was called into her pharmacy for her. Patient will be recheck in 2-3 weeks for reevaluation advised to contact any further changes or exacerbations occur.  Harriet Masson DPM

## 2014-01-16 ENCOUNTER — Ambulatory Visit (INDEPENDENT_AMBULATORY_CARE_PROVIDER_SITE_OTHER): Payer: BC Managed Care – PPO

## 2014-01-16 VITALS — BP 143/87 | HR 64 | Resp 12

## 2014-01-16 DIAGNOSIS — Z9889 Other specified postprocedural states: Secondary | ICD-10-CM

## 2014-01-16 NOTE — Patient Instructions (Signed)
ICE INSTRUCTIONS  Apply ice or cold pack to the affected area at least 3 times a day for 10-15 minutes each time.  You should also use ice after prolonged activity or vigorous exercise.  Do not apply ice longer than 20 minutes at one time.  Always keep a cloth between your skin and the ice pack to prevent burns.  Being consistent and following these instructions will help control your symptoms.  We suggest you purchase a gel ice pack because they are reusable and do bit leak.  Some of them are designed to wrap around the area.  Use the method that works best for you.  Here are some other suggestions for icing.   Use a frozen bag of peas or corn-inexpensive and molds well to your body, usually stays frozen for 10 to 20 minutes.  Wet a towel with cold water and squeeze out the excess until it's damp.  Place in a bag in the freezer for 20 minutes. Then remove and use.  Maintain a stiff soled shoe compression stocking and some Advil or naproxen as needed for pain

## 2014-01-16 NOTE — Progress Notes (Addendum)
   Subjective:    Patient ID: Boykin Nearing, female    DOB: Mar 07, 1966, 48 y.o.   MRN: [id number]  HPI patient presents this time shoes aren't felt 4 months postop hallux varus repair and second met osteotomy of the left foot. Continues to have pain on weightbearing and standing points to the second metatarsal area plantarly as well as some pain and dorsally on palpation   Review of Systems no new changes in any systems or new findings noted     Objective:   Physical Exam Masker status is intact pedal pulses palpable epicritic and proprioceptive sensations intact and symmetric. 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 The first met osteotomy appears to be stable with no displacement of the K wire the hallux is still a good stable position clinically radiographically there still some slight varus although minimal. There is no pain on range of motion of the great toe joint however there is tenderness on palpation of the second metatarsal neck and shaft area as well as second MTP joint movement. 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       Assessment & Plan:  Discussed with the patient that there is a shifting or backing out of the screw with a shifting of the capital fragment and posse non-union of the bone would benefit from a bone growth stimulator which can reduce the pain reduce the swelling and edema in the and speed up increase the healing process which is currently incomplete at this time. We'll make arrangements for bone growth stimulator patient to contact with the next couple days and initiate appropriate therapy thereafter. In the interim maintain a stiff soled shoe no longer needs to  buddy splint the toes. The toes are staying in a rectus position to maintain an NSAID of her choice as needed for pain and apply ice as needed and maintain compression stocking if needed for swelling. Likely will be seen back with the next week or 2 once bone growth stimulator is been approved. Next  Harriet Masson DPM

## 2014-01-17 ENCOUNTER — Telehealth: Payer: Self-pay | Admitting: *Deleted

## 2014-01-17 NOTE — Telephone Encounter (Signed)
Exogen Bone Stimulator ordered [employer] [address] [place], [place] [address]

## 2014-03-21 ENCOUNTER — Other Ambulatory Visit: Payer: Self-pay | Admitting: Orthopedic Surgery

## 2014-03-21 DIAGNOSIS — M79672 Pain in left foot: Secondary | ICD-10-CM

## 2014-03-22 ENCOUNTER — Ambulatory Visit
Admission: RE | Admit: 2014-03-22 | Discharge: 2014-03-22 | Disposition: A | Discharge status: Home / Self Care | Payer: BC Managed Care – PPO | Source: Ambulatory Visit | Attending: Orthopedic Surgery | Admitting: Orthopedic Surgery

## 2014-03-22 ENCOUNTER — Encounter (INDEPENDENT_AMBULATORY_CARE_PROVIDER_SITE_OTHER): Payer: Self-pay

## 2014-03-22 DIAGNOSIS — M79672 Pain in left foot: Secondary | ICD-10-CM

## 2014-07-01 ENCOUNTER — Other Ambulatory Visit: Payer: Self-pay

## 2014-07-01 DIAGNOSIS — Z1231 Encounter for screening mammogram for malignant neoplasm of breast: Secondary | ICD-10-CM

## 2014-07-03 ENCOUNTER — Ambulatory Visit
Admission: RE | Admit: 2014-07-03 | Discharge: 2014-07-03 | Disposition: A | Discharge status: Home / Self Care | Payer: BC Managed Care – PPO | Source: Ambulatory Visit

## 2014-07-03 DIAGNOSIS — Z1231 Encounter for screening mammogram for malignant neoplasm of breast: Secondary | ICD-10-CM

## 2014-07-05 ENCOUNTER — Ambulatory Visit: Payer: BC Managed Care – PPO

## 2015-03-14 ENCOUNTER — Encounter: Payer: Self-pay | Admitting: Gynecology

## 2015-03-21 ENCOUNTER — Ambulatory Visit (INDEPENDENT_AMBULATORY_CARE_PROVIDER_SITE_OTHER): Payer: BLUE CROSS/BLUE SHIELD | Admitting: Gynecology

## 2015-03-21 ENCOUNTER — Encounter: Payer: Self-pay | Admitting: Gynecology

## 2015-03-21 VITALS — BP 118/74 | Ht 61.0 in | Wt 139.0 lb

## 2015-03-21 DIAGNOSIS — Z01419 Encounter for gynecological examination (general) (routine) without abnormal findings: Secondary | ICD-10-CM | POA: Diagnosis not present

## 2015-03-21 DIAGNOSIS — N951 Menopausal and female climacteric states: Secondary | ICD-10-CM

## 2015-03-21 LAB — CBC WITH DIFFERENTIAL/PLATELET
BASOS PCT: 1 % (ref 0–1)
Basophils Absolute: 0.1 10*3/uL (ref 0.0–0.1)
EOS ABS: 0.1 10*3/uL (ref 0.0–0.7)
Eosinophils Relative: 2 % (ref 0–5)
HCT: 40.4 % (ref 36.0–46.0)
HEMOGLOBIN: 13.2 g/dL (ref 12.0–15.0)
LYMPHS ABS: 2.2 10*3/uL (ref 0.7–4.0)
LYMPHS PCT: 44 % (ref 12–46)
MCH: 31 pg (ref 26.0–34.0)
MCHC: 32.7 g/dL (ref 30.0–36.0)
MCV: 94.8 fL (ref 78.0–100.0)
MONO ABS: 0.3 10*3/uL (ref 0.1–1.0)
MPV: 9.3 fL (ref 8.6–12.4)
Monocytes Relative: 6 % (ref 3–12)
NEUTROS ABS: 2.4 10*3/uL (ref 1.7–7.7)
NEUTROS PCT: 47 % (ref 43–77)
PLATELETS: 281 10*3/uL (ref 150–400)
RBC: 4.26 MIL/uL (ref 3.87–5.11)
RDW: 13.3 % (ref 11.5–15.5)
WBC: 5.1 10*3/uL (ref 4.0–10.5)

## 2015-03-21 NOTE — Patient Instructions (Signed)
Make an appointment to see a dermatologist in reference to the nodules underneath your skin.  You may obtain a copy of any labs that were done today by logging onto MyChart as outlined in the instructions provided with your AVS (after visit summary). The office will not call with normal lab results but certainly if there are any significant abnormalities then we will contact you.   Health Maintenance, Female A healthy lifestyle and preventative care can promote health and wellness.  Maintain regular health, dental, and eye exams.  Eat a healthy diet. Foods like vegetables, fruits, whole grains, low-fat dairy products, and lean protein foods contain the nutrients you need without too many calories. Decrease your intake of foods high in solid fats, added sugars, and salt. Get information about a proper diet from your caregiver, if necessary.  Regular physical exercise is one of the most important things you can do for your health. Most adults should get at least 150 minutes of moderate-intensity exercise (any activity that increases your heart rate and causes you to sweat) each week. In addition, most adults need muscle-strengthening exercises on 2 or more days a week.   Maintain a healthy weight. The body mass index (BMI) is a screening tool to identify possible weight problems. It provides an estimate of body fat based on height and weight. Your caregiver can help determine your BMI, and can help you achieve or maintain a healthy weight. For adults 20 years and older:  A BMI below 18.5 is considered underweight.  A BMI of 18.5 to 24.9 is normal.  A BMI of 25 to 29.9 is considered overweight.  A BMI of 30 and above is considered obese.  Maintain normal blood lipids and cholesterol by exercising and minimizing your intake of saturated fat. Eat a balanced diet with plenty of fruits and vegetables. Blood tests for lipids and cholesterol should begin at age 54 and be repeated every 5 years. If  your lipid or cholesterol levels are high, you are over 50, or you are a high risk for heart disease, you may need your cholesterol levels checked more frequently.Ongoing high lipid and cholesterol levels should be treated with medicines if diet and exercise are not effective.  If you smoke, find out from your caregiver how to quit. If you do not use tobacco, do not start.  Lung cancer screening is recommended for adults aged 26 80 years who are at high risk for developing lung cancer because of a history of smoking. Yearly low-dose computed tomography (CT) is recommended for people who have at least a 30-pack-year history of smoking and are a current smoker or have quit within the past 15 years. A pack year of smoking is smoking an average of 1 pack of cigarettes a day for 1 year (for example: 1 pack a day for 30 years or 2 packs a day for 15 years). Yearly screening should continue until the smoker has stopped smoking for at least 15 years. Yearly screening should also be stopped for people who develop a health problem that would prevent them from having lung cancer treatment.  If you are pregnant, do not drink alcohol. If you are breastfeeding, be very cautious about drinking alcohol. If you are not pregnant and choose to drink alcohol, do not exceed 1 drink per day. One drink is considered to be 12 ounces (355 mL) of beer, 5 ounces (148 mL) of wine, or 1.5 ounces (44 mL) of liquor.  Avoid use of street drugs. Do  not share needles with anyone. Ask for help if you need support or instructions about stopping the use of drugs.  High blood pressure causes heart disease and increases the risk of stroke. Blood pressure should be checked at least every 1 to 2 years. Ongoing high blood pressure should be treated with medicines, if weight loss and exercise are not effective.  If you are 66 to 50 years old, ask your caregiver if you should take aspirin to prevent strokes.  Diabetes screening involves taking  a blood sample to check your fasting blood sugar level. This should be done once every 3 years, after age 58, if you are within normal weight and without risk factors for diabetes. Testing should be considered at a younger age or be carried out more frequently if you are overweight and have at least 1 risk factor for diabetes.  Breast cancer screening is essential preventative care for women. You should practice "breast self-awareness." This means understanding the normal appearance and feel of your breasts and may include breast self-examination. Any changes detected, no matter how small, should be reported to a caregiver. Women in their 37s and 30s should have a clinical breast exam (CBE) by a caregiver as part of a regular health exam every 1 to 3 years. After age 56, women should have a CBE every year. Starting at age 54, women should consider having a mammogram (breast X-ray) every year. Women who have a family history of breast cancer should talk to their caregiver about genetic screening. Women at a high risk of breast cancer should talk to their caregiver about having an MRI and a mammogram every year.  Breast cancer gene (BRCA)-related cancer risk assessment is recommended for women who have family members with BRCA-related cancers. BRCA-related cancers include breast, ovarian, tubal, and peritoneal cancers. Having family members with these cancers may be associated with an increased risk for harmful changes (mutations) in the breast cancer genes BRCA1 and BRCA2. Results of the assessment will determine the need for genetic counseling and BRCA1 and BRCA2 testing.  The Pap test is a screening test for cervical cancer. Women should have a Pap test starting at age 64. Between ages 88 and 10, Pap tests should be repeated every 2 years. Beginning at age 53, you should have a Pap test every 3 years as long as the past 3 Pap tests have been normal. If you had a hysterectomy for a problem that was not cancer  or a condition that could lead to cancer, then you no longer need Pap tests. If you are between ages 21 and 51, and you have had normal Pap tests going back 10 years, you no longer need Pap tests. If you have had past treatment for cervical cancer or a condition that could lead to cancer, you need Pap tests and screening for cancer for at least 20 years after your treatment. If Pap tests have been discontinued, risk factors (such as a new sexual partner) need to be reassessed to determine if screening should be resumed. Some women have medical problems that increase the chance of getting cervical cancer. In these cases, your caregiver may recommend more frequent screening and Pap tests.  The human papillomavirus (HPV) test is an additional test that may be used for cervical cancer screening. The HPV test looks for the virus that can cause the cell changes on the cervix. The cells collected during the Pap test can be tested for HPV. The HPV test could be used to  screen women aged 23 years and older, and should be used in women of any age who have unclear Pap test results. After the age of 54, women should have HPV testing at the same frequency as a Pap test.  Colorectal cancer can be detected and often prevented. Most routine colorectal cancer screening begins at the age of 70 and continues through age 42. However, your caregiver may recommend screening at an earlier age if you have risk factors for colon cancer. On a yearly basis, your caregiver may provide home test kits to check for hidden blood in the stool. Use of a small camera at the end of a tube, to directly examine the colon (sigmoidoscopy or colonoscopy), can detect the earliest forms of colorectal cancer. Talk to your caregiver about this at age 41, when routine screening begins. Direct examination of the colon should be repeated every 5 to 10 years through age 51, unless early forms of pre-cancerous polyps or small growths are found.  Hepatitis C  blood testing is recommended for all people born from [birth year] through 1966 and any individual with known risks for hepatitis C.  Practice safe sex. Use condoms and avoid high-risk sexual practices to reduce the spread of sexually transmitted infections (STIs). Sexually active women aged 84 and younger should be checked for Chlamydia, which is a common sexually transmitted infection. Older women with new or multiple partners should also be tested for Chlamydia. Testing for other STIs is recommended if you are sexually active and at increased risk.  Osteoporosis is a disease in which the bones lose minerals and strength with aging. This can result in serious bone fractures. The risk of osteoporosis can be identified using a bone density scan. Women ages 63 and over and women at risk for fractures or osteoporosis should discuss screening with their caregivers. Ask your caregiver whether you should be taking a calcium supplement or vitamin D to reduce the rate of osteoporosis.  Menopause can be associated with physical symptoms and risks. Hormone replacement therapy is available to decrease symptoms and risks. You should talk to your caregiver about whether hormone replacement therapy is right for you.  Use sunscreen. Apply sunscreen liberally and repeatedly throughout the day. You should seek shade when your shadow is shorter than you. Protect yourself by wearing long sleeves, pants, a wide-brimmed hat, and sunglasses year round, whenever you are outdoors.  Notify your caregiver of new moles or changes in moles, especially if there is a change in shape or color. Also notify your caregiver if a mole is larger than the size of a pencil eraser.  Stay current with your immunizations. Document Released: 04/12/2011 Document Revised: 01/22/2013 Document Reviewed: 04/12/2011 [facility] Patient Information 2014 [place].

## 2015-03-21 NOTE — Progress Notes (Signed)
Stacey Wagner 1966-07-14 [id number]        49 y.o.  G2P2002 for annual exam.  Several issues noted below.  Past medical history,surgical history, problem list, medications, allergies, family history and social history were all reviewed and documented as reviewed in the EPIC chart.  ROS:  Performed with pertinent positives and negatives included in the history, assessment and plan.   Additional significant findings :  none   Exam: [facility] person Vitals:   03/21/15 0939  BP: 118/74  Height: 5\' 1"  (1.549 m)  Weight: 139 lb (63.05 kg)   General appearance:  Normal affect, orientation and appearance. Skin: Grossly normal excepting numerous subcutaneous nodules inner aspect of both forearms HEENT: Without gross lesions.  No cervical or supraclavicular adenopathy. Thyroid normal.  Lungs:  Clear without wheezing, rales or rhonchi Cardiac: RR, without RMG Abdominal:  Soft, nontender, without masses, guarding, rebound, organomegaly or hernia Breasts:  Examined lying and sitting without masses, retractions, discharge or axillary adenopathy. Pelvic:  Ext/BUS/vagina normal  Adnexa  Without masses or tenderness    Anus and perineum  Normal   Rectovaginal  Normal sphincter tone without palpated masses or tenderness.    Assessment/Plan:  49 y.o. [id number] female for annual exam.   1. Menopausal symptoms. Patient continues to have hot flashes and some sweats. FSH 2 years ago was 27. Recheck FSH/TSH today. 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 Options for treatment reviewed to include OTC products versus HRT. Risks benefits to include increased risk of stroke heart attack DVT possible breast cancer reviewed. Patient not interested in estrogen at this time. Will call if worsens and wants to consider. 2. Numerous subcutaneous nodules both forearms. Present for years but seems to be getting worse. Recommended follow up with dermatologist and she is going to call make appointment to do so. 3. Pap smear  2014. No Pap smear done today. No history of abnormal Pap smears. Options to stop screening versus less frequent screening intervals as she is status post hysterectomy for benign indications reviewed. Will readdress on an annual basis. 4. Mammography 2015. Continue with annual mammography when due. SBE monthly reviewed. 5. Colonoscopy 5 years ago. She is going to call the gastroenterologist to see when they want to repeat this. 6. Health maintenance. Baseline CBC comprehensive metabolic panel lipid profile urinalysis vitamin D ordered along with her FSH and TSH. Follow up in one year, sooner as needed.     Dara Lords MD, 10:08 AM 03/21/2015

## 2015-03-22 LAB — URINALYSIS W MICROSCOPIC + REFLEX CULTURE
BACTERIA UA: NONE SEEN
Bilirubin Urine: NEGATIVE
Casts: NONE SEEN
Crystals: NONE SEEN
GLUCOSE, UA: NEGATIVE mg/dL
Hgb urine dipstick: NEGATIVE
KETONES UR: NEGATIVE mg/dL
Leukocytes, UA: NEGATIVE
Nitrite: NEGATIVE
PH: 7 (ref 5.0–8.0)
Protein, ur: NEGATIVE mg/dL
Specific Gravity, Urine: 1.007 (ref 1.005–1.030)
Squamous Epithelial / LPF: NONE SEEN
UROBILINOGEN UA: 0.2 mg/dL (ref 0.0–1.0)

## 2015-03-22 LAB — COMPREHENSIVE METABOLIC PANEL
ALBUMIN: 4.3 g/dL (ref 3.5–5.2)
ALT: 22 U/L (ref 0–35)
AST: 26 U/L (ref 0–37)
Alkaline Phosphatase: 53 U/L (ref 39–117)
BUN: 15 mg/dL (ref 6–23)
CALCIUM: 9.4 mg/dL (ref 8.4–10.5)
CO2: 23 mEq/L (ref 19–32)
CREATININE: 0.8 mg/dL (ref 0.50–1.10)
Chloride: 103 mEq/L (ref 96–112)
Glucose, Bld: 105 mg/dL — ABNORMAL HIGH (ref 70–99)
Potassium: 4.4 mEq/L (ref 3.5–5.3)
Sodium: 139 mEq/L (ref 135–145)
TOTAL PROTEIN: 7.3 g/dL (ref 6.0–8.3)
Total Bilirubin: 0.7 mg/dL (ref 0.2–1.2)

## 2015-03-22 LAB — LIPID PANEL
Cholesterol: 190 mg/dL (ref 0–200)
HDL: 73 mg/dL (ref >=46)
LDL CALC: 103 mg/dL — AB (ref 0–99)
Total CHOL/HDL Ratio: 2.6 Ratio
Triglycerides: 72 mg/dL (ref <150)
VLDL: 14 mg/dL (ref 0–40)

## 2015-03-22 LAB — FOLLICLE STIMULATING HORMONE: FSH: 11.8 m[IU]/mL

## 2015-03-22 LAB — TSH: TSH: 0.931 u[IU]/mL (ref 0.350–4.500)

## 2015-03-22 LAB — VITAMIN D 25 HYDROXY (VIT D DEFICIENCY, FRACTURES): VIT D 25 HYDROXY: 23 ng/mL — AB (ref 30–100)

## 2015-03-25 ENCOUNTER — Other Ambulatory Visit: Payer: Self-pay | Admitting: Gynecology

## 2015-03-25 DIAGNOSIS — E559 Vitamin D deficiency, unspecified: Secondary | ICD-10-CM

## 2015-03-25 DIAGNOSIS — R7309 Other abnormal glucose: Secondary | ICD-10-CM

## 2015-06-23 ENCOUNTER — Other Ambulatory Visit: Payer: BLUE CROSS/BLUE SHIELD

## 2015-06-23 DIAGNOSIS — R7309 Other abnormal glucose: Secondary | ICD-10-CM

## 2015-06-23 DIAGNOSIS — E559 Vitamin D deficiency, unspecified: Secondary | ICD-10-CM

## 2015-06-23 LAB — GLUCOSE, RANDOM: Glucose, Bld: 89 mg/dL (ref 65–99)

## 2015-06-24 LAB — VITAMIN D 25 HYDROXY (VIT D DEFICIENCY, FRACTURES): VIT D 25 HYDROXY: 32 ng/mL (ref 30–100)

## 2015-06-30 ENCOUNTER — Other Ambulatory Visit: Payer: BLUE CROSS/BLUE SHIELD

## 2015-09-26 ENCOUNTER — Encounter: Payer: Self-pay | Admitting: Gynecology

## 2015-09-26 ENCOUNTER — Ambulatory Visit (INDEPENDENT_AMBULATORY_CARE_PROVIDER_SITE_OTHER): Payer: BLUE CROSS/BLUE SHIELD | Admitting: Gynecology

## 2015-09-26 VITALS — BP 120/76

## 2015-09-26 DIAGNOSIS — N898 Other specified noninflammatory disorders of vagina: Secondary | ICD-10-CM | POA: Diagnosis not present

## 2015-09-26 DIAGNOSIS — R300 Dysuria: Secondary | ICD-10-CM | POA: Diagnosis not present

## 2015-09-26 DIAGNOSIS — R102 Pelvic and perineal pain: Secondary | ICD-10-CM | POA: Diagnosis not present

## 2015-09-26 LAB — URINALYSIS W MICROSCOPIC + REFLEX CULTURE
Bacteria, UA: NONE SEEN [HPF]
Bilirubin Urine: NEGATIVE
Casts: NONE SEEN [LPF]
Crystals: NONE SEEN [HPF]
Glucose, UA: NEGATIVE
Hgb urine dipstick: NEGATIVE
Ketones, ur: NEGATIVE
Leukocytes, UA: NEGATIVE
Nitrite: NEGATIVE
Protein, ur: NEGATIVE
RBC / HPF: NONE SEEN RBC/HPF (ref <=2)
Specific Gravity, Urine: 1.02 (ref 1.001–1.035)
WBC, UA: NONE SEEN WBC/HPF (ref <=5)
Yeast: NONE SEEN [HPF]
pH: 6 (ref 5.0–8.0)

## 2015-09-26 LAB — WET PREP FOR TRICH, YEAST, CLUE
CLUE CELLS WET PREP: NONE SEEN
Trich, Wet Prep: NONE SEEN

## 2015-09-26 MED ORDER — FLUCONAZOLE 200 MG PO TABS: 200.0000 mg | ORAL_TABLET | Freq: Every day | ORAL | Status: DC

## 2015-09-26 NOTE — Progress Notes (Signed)
Stacey Wagner 05/10/1966 [id number]        49 y.o.  [id number] Presents with 2 weeks of pelvic discomfort. Patient says that she feels bloated. Also has some urinary urgency with pressure. Says it feels like her urethra stings or aches. No nausea vomiting diarrhea constipation. No fever or chills. Status post hysterectomy for leiomyoma in the past.  Past medical history,surgical history, problem list, medications, allergies, family history and social history were all reviewed and documented in the EPIC chart.  Directed ROS with pertinent positives and negatives documented in the history of present illness/assessment and plan.  Exam: [facility] assistant Filed Vitals:   09/26/15 1409  BP: 120/76   General appearance:  Normal Spine straight without CVA tenderness Abdomen soft nontender without masses guarding rebound Pelvic external BUS vagina with thick white discharge. Bimanual without masses or tenderness. Rectal exam is normal.  Assessment/Plan:  49 y.o. [id number] with the above history and exam. Wet prep is positive for yeast. Urinalysis is negative. I suspect her urinary symptoms and urethral discomfort is due to yeast. Will cover with Diflucan 200 mg daily 5 days to get good tissue coverage.  This may also account for her pelvic bloating feeling. I also recommend a pelvic ultrasound for ovarian surveillance. Do not feel any masses but what to make sure given this bloating history and she'll schedule and follow up for that.    Dara LordsFONTAINE,Stacey Wagner P MD, 2:35 PM 09/26/2015

## 2015-09-26 NOTE — Patient Instructions (Signed)
Take the Diflucan pill daily for 5 days. Follow up for the ultrasound as scheduled.

## 2015-09-26 NOTE — Addendum Note (Signed)
Addended by: Dayna BarkerGARDNER, KIMBERLY K on: 09/26/2015 03:10 PM   Modules accepted: Orders

## 2015-10-07 ENCOUNTER — Ambulatory Visit (INDEPENDENT_AMBULATORY_CARE_PROVIDER_SITE_OTHER): Payer: BLUE CROSS/BLUE SHIELD | Admitting: Gynecology

## 2015-10-07 ENCOUNTER — Encounter: Payer: Self-pay | Admitting: Gynecology

## 2015-10-07 ENCOUNTER — Ambulatory Visit (INDEPENDENT_AMBULATORY_CARE_PROVIDER_SITE_OTHER): Payer: BLUE CROSS/BLUE SHIELD

## 2015-10-07 VITALS — BP 122/80 | Ht 61.0 in | Wt 139.0 lb

## 2015-10-07 DIAGNOSIS — R102 Pelvic and perineal pain: Secondary | ICD-10-CM

## 2015-10-07 NOTE — Progress Notes (Signed)
Waldemar DickensMaria L Bristow 01/03/1966 [id number]        49 y.o.  [id number] Presents for ultrasound. Patient was evaluated one half weeks ago with some dysuria and pelvic discomfort. Was thought to be secondary to a yeast and was treated with Diflucan. Urinalysis was negative. Notes that her urinary symptoms seem better although she still having some pelvic discomfort. Her exam at that point was normal and ultrasound was ordered to rule out nonpalpable abnormalities.  Patient is status post TAH for leiomyoma.  Past medical history,surgical history, problem list, medications, allergies, family history and social history were all reviewed and documented in the EPIC chart.  Directed ROS with pertinent positives and negatives documented in the history of present illness/assessment and plan.  Exam: Filed Vitals:   10/07/15 1525  BP: 122/80  Height: 5\' 1"  (1.549 m)  Weight: 139 lb (63.05 kg)   General appearance:  Normal  Ultrasound shows vaginal cuff normal. Right ovary was not identified but no abnormalities noted in the right adnexa. Left ovary with an echo-free thick-walled avascular area 13 x 10 x 10 mm consistent with corpus luteum. No free fluid.  Assessment/Plan:  49 y.o. [id number] with pelvic discomfort. Ultrasound is negative. No other associated symptoms such as urinary or GI. Recommend observe for now but if continues may want to have orthopedics evaluate. Patient will follow up if her discomfort continues.    Dara LordsFONTAINE,Jakayla Schweppe P MD, 3:38 PM 10/07/2015

## 2015-10-07 NOTE — Patient Instructions (Signed)
Follow-up if your symptoms persist. 

## 2016-02-19 ENCOUNTER — Ambulatory Visit: Payer: Self-pay | Admitting: Internal Medicine

## 2016-04-02 ENCOUNTER — Encounter: Payer: Self-pay | Admitting: Internal Medicine

## 2016-04-02 ENCOUNTER — Ambulatory Visit (INDEPENDENT_AMBULATORY_CARE_PROVIDER_SITE_OTHER): Payer: BLUE CROSS/BLUE SHIELD | Admitting: Internal Medicine

## 2016-04-02 VITALS — BP 130/84 | HR 58 | Ht 61.0 in | Wt 136.8 lb

## 2016-04-02 DIAGNOSIS — Z8249 Family history of ischemic heart disease and other diseases of the circulatory system: Secondary | ICD-10-CM | POA: Diagnosis not present

## 2016-04-02 NOTE — Patient Instructions (Addendum)
Medication Instructions:  Your physician recommends that you continue on your current medications as directed. Please refer to the Current Medication list given to you today.  Labwork: Cardio IQ soon   Testing/Procedures: Noon   Follow-Up: Will determine after Dr Tenny Crawoss reviews your lab   If you need a refill on your cardiac medications before your next appointment, please call your pharmacy.

## 2016-04-02 NOTE — Progress Notes (Signed)
Cardiology Office Note   Date:  04/02/2016   ID:  Stacey Wagner, DOB 04/14/1966, MRN [id number]  PCP:  Leanor RubensteinSUN,VYVYAN Y, MD  Cardiologist:   Dietrich PatesPaula Tamalyn Wadsworth, MD   Pt referred by  Dr Audie BoxFontaine for cardiac risk assessment   History of Present Illness: Stacey ArntMaria L Wagner is a 50 y.o. 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 BIOTIN PO Take 1 tablet by mouth daily.     . Cholecalciferol (VITAMIN D PO) Take 1 tablet by mouth daily.     . Probiotic Product (PROBIOTIC PO) Take 1 tablet by mouth daily.      No facility-administered medications prior to visit.     Allergies:   Cephalexin and Penicillins   No past medical history on file.  Past Surgical History  Procedure Laterality Date  . Breast surgery      cyst removed from left breast  . Bunionectomy  [id number]  . Wisdom tooth extraction    . Abdominal hysterectomy  2009    partial hysterectomy leiomyomata  . Foot surgery       Social History:  The patient  reports that she has quit smoking. She has never used smokeless tobacco. She reports that she drinks alcohol. She reports that she does not use illicit drugs.   Family History:  The patient's family history includes Cancer in her maternal grandfather, maternal grandmother, and son; Hyperlipidemia in her father; Hypertension in her father and mother.    ROS:  Please see the history of present illness. All other systems are reviewed and  Negative to the above problem except as noted.    PHYSICAL EXAM: VS:  BP 130/84 mmHg  Pulse 58  Ht 5\' 1"  (1.549 m)  Wt 136 lb 12.8 oz (62.052 kg)  BMI 25.86 kg/m2  GEN: Well nourished, well developed, in no acute distress HEENT:  normal Neck: no JVD, carotid bruits, or masses Cardiac: RRR; no murmurs, rubs, or gallops,no edema  Respiratory:  clear to auscultation bilaterally, normal work of breathing GI: soft, nontender, nondistended, + BS  No hepatomegaly  MS: no deformity Moving all extremities   Skin: warm and dry, no rash Neuro:  Strength and sensation are intact Psych: euthymic mood, full affect   EKG:  EKG is ordered today.  SB 58 bpm     Lipid Panel    Component Value Date/Time   CHOL 190 03/21/2015 1020   TRIG 72 03/21/2015 1020   HDL 73 03/21/2015 1020   CHOLHDL 2.6 03/21/2015 1020   VLDL 14 03/21/2015 1020   LDLCALC 103* 03/21/2015 1020      Wt Readings from Last 3 Encounters:  04/02/16 136 lb 12.8 oz (62.052 kg)  10/07/15 139 lb (63.05 kg)  03/21/15 139 lb (63.05 kg)      ASSESSMENT AND PLAN:  Pt is a 50 yo witn no known history of CAD  Lipids excellent  Active  She seems at low risk for cardiac event   Sister with CAD at 6846  She thinks chol may be a little higher but not real  bad    I would recomm a cardio IQ test  Remain active Follow up based on test results    Signed, Dietrich PatesPaula Bemnet Trovato, MD  04/02/2016 3:49 PM    [facility] [address], [place], [place]  [address] Phone: [phone]; Fax: [phone]

## 2016-04-08 ENCOUNTER — Other Ambulatory Visit: Payer: BLUE CROSS/BLUE SHIELD

## 2016-06-28 ENCOUNTER — Ambulatory Visit (INDEPENDENT_AMBULATORY_CARE_PROVIDER_SITE_OTHER): Payer: BLUE CROSS/BLUE SHIELD | Admitting: Gynecology

## 2016-06-28 ENCOUNTER — Other Ambulatory Visit: Payer: Self-pay | Admitting: Gynecology

## 2016-06-28 ENCOUNTER — Encounter: Payer: Self-pay | Admitting: Gynecology

## 2016-06-28 VITALS — BP 118/76 | Ht 61.0 in | Wt 138.0 lb

## 2016-06-28 DIAGNOSIS — Z01419 Encounter for gynecological examination (general) (routine) without abnormal findings: Secondary | ICD-10-CM | POA: Diagnosis not present

## 2016-06-28 DIAGNOSIS — N951 Menopausal and female climacteric states: Secondary | ICD-10-CM | POA: Diagnosis not present

## 2016-06-28 DIAGNOSIS — N644 Mastodynia: Secondary | ICD-10-CM | POA: Diagnosis not present

## 2016-06-28 DIAGNOSIS — E559 Vitamin D deficiency, unspecified: Secondary | ICD-10-CM | POA: Diagnosis not present

## 2016-06-28 DIAGNOSIS — J302 Other seasonal allergic rhinitis: Secondary | ICD-10-CM | POA: Diagnosis not present

## 2016-06-28 DIAGNOSIS — Z1231 Encounter for screening mammogram for malignant neoplasm of breast: Secondary | ICD-10-CM

## 2016-06-28 DIAGNOSIS — Z13220 Encounter for screening for lipoid disorders: Secondary | ICD-10-CM

## 2016-06-28 LAB — CBC WITH DIFFERENTIAL/PLATELET
BASOS ABS: 62 {cells}/uL (ref 0–200)
Basophils Relative: 1 %
Eosinophils Absolute: 248 cells/uL (ref 15–500)
Eosinophils Relative: 4 %
HEMATOCRIT: 41.9 % (ref 35.0–45.0)
HEMOGLOBIN: 13.7 g/dL (ref 11.7–15.5)
LYMPHS ABS: 2852 {cells}/uL (ref 850–3900)
Lymphocytes Relative: 46 %
MCH: 30.9 pg (ref 27.0–33.0)
MCHC: 32.7 g/dL (ref 32.0–36.0)
MCV: 94.4 fL (ref 80.0–100.0)
MONO ABS: 434 {cells}/uL (ref 200–950)
MPV: 9.5 fL (ref 7.5–12.5)
Monocytes Relative: 7 %
Neutro Abs: 2604 cells/uL (ref 1500–7800)
Neutrophils Relative %: 42 %
Platelets: 307 10*3/uL (ref 140–400)
RBC: 4.44 MIL/uL (ref 3.80–5.10)
RDW: 12.8 % (ref 11.0–15.0)
WBC: 6.2 10*3/uL (ref 3.8–10.8)

## 2016-06-28 MED ORDER — MOMETASONE FUROATE 50 MCG/ACT NA SUSP: 2.0000 | Freq: Every day | NASAL | 3 refills | Status: DC

## 2016-06-28 NOTE — Patient Instructions (Signed)
Mammogram office will call to arrange the mammogram and ultrasound. Call my office if you do not hear from them. Try the Nasonex for the allergic rhinitis. Call if your menopausal symptoms worsen and you want to discuss hormone replacement therapy.  You may obtain a copy of any labs that were done today by logging onto MyChart as outlined in the instructions provided with your AVS (after visit summary). The office will not call with normal lab results but certainly if there are any significant abnormalities then we will contact you.   Health Maintenance Adopting a healthy lifestyle and getting preventive care can go a long way to promote health and wellness. Talk with your health care provider about what schedule of regular examinations is right for you. This is a good chance for you to check in with your provider about disease prevention and staying healthy. In between checkups, there are plenty of things you can do on your own. Experts have done a lot of research about which lifestyle changes and preventive measures are most likely to keep you healthy. Ask your health care provider for more information. WEIGHT AND DIET  Eat a healthy diet  Be sure to include plenty of vegetables, fruits, low-fat dairy products, and lean protein.  Do not eat a lot of foods high in solid fats, added sugars, or salt.  Get regular exercise. This is one of the most important things you can do for your health.  Most adults should exercise for at least 150 minutes each week. The exercise should increase your heart rate and make you sweat (moderate-intensity exercise).  Most adults should also do strengthening exercises at least twice a week. This is in addition to the moderate-intensity exercise.  Maintain a healthy weight  Body mass index (BMI) is a measurement that can be used to identify possible weight problems. It estimates body fat based on height and weight. Your health care provider can help determine your  BMI and help you achieve or maintain a healthy weight.  For females 56 years of age and older:   A BMI below 18.5 is considered underweight.  A BMI of 18.5 to 24.9 is normal.  A BMI of 25 to 29.9 is considered overweight.  A BMI of 30 and above is considered obese.  Watch levels of cholesterol and blood lipids  You should start having your blood tested for lipids and cholesterol at 51 years of age, then have this test every 5 years.  You may need to have your cholesterol levels checked more often if:  Your lipid or cholesterol levels are high.  You are older than 51 years of age.  You are at high risk for heart disease.  CANCER SCREENING   Lung Cancer  Lung cancer screening is recommended for adults 70-76 years old who are at high risk for lung cancer because of a history of smoking.  A yearly low-dose CT scan of the lungs is recommended for people who:  Currently smoke.  Have quit within the past 15 years.  Have at least a 30-pack-year history of smoking. A pack year is smoking an average of one pack of cigarettes a day for 1 year.  Yearly screening should continue until it has been 15 years since you quit.  Yearly screening should stop if you develop a health problem that would prevent you from having lung cancer treatment.  Breast Cancer  Practice breast self-awareness. This means understanding how your breasts normally appear and feel.  It also  doing regular breast self-exams. Let your health care provider know about any changes, no matter how small.  If you are in your 20s or 30s, you should have a clinical breast exam (CBE) by a health care provider every 1-3 years as part of a regular health exam.  If you are 40 or older, have a CBE every year. Also consider having a breast X-ray (mammogram) every year.  If you have a family history of breast cancer, talk to your health care provider about genetic screening.  If you are at high risk for breast cancer,  talk to your health care provider about having an MRI and a mammogram every year.  Breast cancer gene (BRCA) assessment is recommended for women who have family members with BRCA-related cancers. BRCA-related cancers include:  Breast.  Ovarian.  Tubal.  Peritoneal cancers.  Results of the assessment will determine the need for genetic counseling and BRCA1 and BRCA2 testing. Cervical Cancer Routine pelvic examinations to screen for cervical cancer are no longer recommended for nonpregnant women who are considered low risk for cancer of the pelvic organs (ovaries, uterus, and vagina) and who do not have symptoms. A pelvic examination may be necessary if you have symptoms including those associated with pelvic infections. Ask your health care provider if a screening pelvic exam is right for you.   The Pap test is the screening test for cervical cancer for women who are considered at risk.  If you had a hysterectomy for a problem that was not cancer or a condition that could lead to cancer, then you no longer need Pap tests.  If you are older than 65 years, and you have had normal Pap tests for the past 10 years, you no longer need to have Pap tests.  If you have had past treatment for cervical cancer or a condition that could lead to cancer, you need Pap tests and screening for cancer for at least 20 years after your treatment.  If you no longer get a Pap test, assess your risk factors if they change (such as having a new sexual partner). This can affect whether you should start being screened again.  Some women have medical problems that increase their chance of getting cervical cancer. If this is the case for you, your health care provider may recommend more frequent screening and Pap tests.  The human papillomavirus (HPV) test is another test that may be used for cervical cancer screening. The HPV test looks for the virus that can cause cell changes in the cervix. The cells collected  during the Pap test can be tested for HPV.  The HPV test can be used to screen women 30 years of age and older. Getting tested for HPV can extend the interval between normal Pap tests from three to five years.  An HPV test also should be used to screen women of any age who have unclear Pap test results.  After 51 years of age, women should have HPV testing as often as Pap tests.  Colorectal Cancer  This type of cancer can be detected and often prevented.  Routine colorectal cancer screening usually begins at 50 years of age and continues through 51 years of age.  Your health care provider may recommend screening at an earlier age if you have risk factors for colon cancer.  Your health care provider may also recommend using home test kits to check for hidden blood in the stool.  A small camera at the end of a   tube can be used to examine your colon directly (sigmoidoscopy or colonoscopy). This is done to check for the earliest forms of colorectal cancer.  Routine screening usually begins at age 50.  Direct examination of the colon should be repeated every 5-10 years through 51 years of age. However, you may need to be screened more often if early forms of precancerous polyps or small growths are found. Skin Cancer  Check your skin from head to toe regularly.  Tell your health care provider about any new moles or changes in moles, especially if there is a change in a mole's shape or color.  Also tell your health care provider if you have a mole that is larger than the size of a pencil eraser.  Always use sunscreen. Apply sunscreen liberally and repeatedly throughout the day.  Protect yourself by wearing long sleeves, pants, a wide-brimmed hat, and sunglasses whenever you are outside. HEART DISEASE, DIABETES, AND HIGH BLOOD PRESSURE   Have your blood pressure checked at least every 1-2 years. High blood pressure causes heart disease and increases the risk of stroke.  If you are  between 55 years and 79 years old, ask your health care provider if you should take aspirin to prevent strokes.  Have regular diabetes screenings. This involves taking a blood sample to check your fasting blood sugar level.  If you are at a normal weight and have a low risk for diabetes, have this test once every three years after 51 years of age.  If you are overweight and have a high risk for diabetes, consider being tested at a younger age or more often. PREVENTING INFECTION  Hepatitis B  If you have a higher risk for hepatitis B, you should be screened for this virus. You are considered at high risk for hepatitis B if:  You were born in a country where hepatitis B is common. Ask your health care provider which countries are considered high risk.  Your parents were born in a high-risk country, and you have not been immunized against hepatitis B (hepatitis B vaccine).  You have HIV or AIDS.  You use needles to inject street drugs.  You live with someone who has hepatitis B.  You have had sex with someone who has hepatitis B.  You get hemodialysis treatment.  You take certain medicines for conditions, including cancer, organ transplantation, and autoimmune conditions. Hepatitis C  Blood testing is recommended for:  Everyone born from 1945 through 1965.  Anyone with known risk factors for hepatitis C. Sexually transmitted infections (STIs)  You should be screened for sexually transmitted infections (STIs) including gonorrhea and chlamydia if:  You are sexually active and are younger than 51 years of age.  You are older than 51 years of age and your health care provider tells you that you are at risk for this type of infection.  Your sexual activity has changed since you were last screened and you are at an increased risk for chlamydia or gonorrhea. Ask your health care provider if you are at risk.  If you do not have HIV, but are at risk, it may be recommended that you  take a prescription medicine daily to prevent HIV infection. This is called pre-exposure prophylaxis (PrEP). You are considered at risk if:  You are sexually active and do not regularly use condoms or know the HIV status of your partner(s).  You take drugs by injection.  You are sexually active with a partner who has HIV. Talk with   with your health care provider about whether you are at high risk of being infected with HIV. If you choose to begin PrEP, you should first be tested for HIV. You should then be tested every 3 months for as long as you are taking PrEP.  PREGNANCY   If you are premenopausal and you may become pregnant, ask your health care provider about preconception counseling.  If you may become pregnant, take 400 to 800 micrograms (mcg) of folic acid every day.  If you want to prevent pregnancy, talk to your health care provider about birth control (contraception). OSTEOPOROSIS AND MENOPAUSE   Osteoporosis is a disease in which the bones lose minerals and strength with aging. This can result in serious bone fractures. Your risk for osteoporosis can be identified using a bone density scan.  If you are 19 years of age or older, or if you are at risk for osteoporosis and fractures, ask your health care provider if you should be screened.  Ask your health care provider whether you should take a calcium or vitamin D supplement to lower your risk for osteoporosis.  Menopause may have certain physical symptoms and risks.  Hormone replacement therapy may reduce some of these symptoms and risks. Talk to your health care provider about whether hormone replacement therapy is right for you.  HOME CARE INSTRUCTIONS   Schedule regular health, dental, and eye exams.  Stay current with your immunizations.   Do not use any tobacco products including cigarettes, chewing tobacco, or electronic cigarettes.  If you are pregnant, do not drink alcohol.  If you are breastfeeding, limit how  much and how often you drink alcohol.  Limit alcohol intake to no more than 1 drink per day for nonpregnant women. One drink equals 12 ounces of beer, 5 ounces of wine, or 1 ounces of hard liquor.  Do not use street drugs.  Do not share needles.  Ask your health care provider for help if you need support or information about quitting drugs.  Tell your health care provider if you often feel depressed.  Tell your health care provider if you have ever been abused or do not feel safe at home. Document Released: 04/12/2011 Document Revised: 02/11/2014 Document Reviewed: 08/29/2013 [facility] Patient Information 2015 [place], [place]. This information is not intended to replace advice given to you by your health care provider. Make sure you discuss any questions you have with your health care provider.

## 2016-06-28 NOTE — Progress Notes (Addendum)
Stacey Wagner 01/08/1966 [id number]        50 y.o.  G2P2002  for annual exam.  Several issues noted below.  Past medical history,surgical history, problem list, medications, allergies, family history and social history were all reviewed and documented as reviewed in the EPIC chart.  ROS:  Performed with pertinent positives and negatives included in the history, assessment and plan.   Additional significant findings :  As noted below   Exam: Stacey Wagner assistant Vitals:   06/28/16 1449  BP: 118/76  Weight: 138 lb (62.6 kg)  Height: 5\' 1"  (1.549 m)   Body mass index is 26.07 kg/m.  General appearance:  Normal affect, orientation and appearance. Skin: Grossly normal HEENT: Without gross lesions.  No cervical or supraclavicular adenopathy. Thyroid normal.  Lungs:  Clear without wheezing, rales or rhonchi Cardiac: RR, without RMG Abdominal:  Soft, nontender, without masses, guarding, rebound, organomegaly or hernia Breasts:  Examined lying and sitting without masses, retractions, discharge or axillary adenopathy. Pelvic:  Ext/BUS/Vagina normal. Pap smear of cuff done  Adnexa without masses or tenderness    Anus and perineum normal   Rectovaginal normal sphincter tone without palpated masses or tenderness.    Assessment/Plan:  50 y.o. [id number] female for annual exam.   1. 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 Having some night sweats and hot flushes. No significant vaginal dryness. FSH last year was normal. Recheck FSH TSH today. Options for management were reviewed up to including HRT. I reviewed the most current NAMS 2017 guidelines. Benefits from a symptom relief, possible early initiation cardiovascular and bone health as well as risks to include thrombosis such as stroke heart attack DVT and breast cancer all reviewed. Sister had early on MI earlier than Stacey Wagner.  Sister was on birth control pills at the time. Stacey HesselbachMaria did see a cardiologist who felt there was no further follow up  needed at that point. At this point patient prefers observation. Will follow up if her symptoms worsen to consider HRT. 2. Rhinitis. Patient having allergic rhinitis. Recently started on Zyrtec last week. Still continues to sniffle. Exam shows no evidence of sinusitis or pharyngitis. We'll start trial of Nasonex with 3 refills. 3. Left breast mastalgia over the past week. Notes in the left tail of spence region aching and discomfort that has continued on and off for the past month. No definitive masses on self exam. No axillary masses or nipple discharge. Physician exam is normal. Last mammogram 2015.  Will start with diagnostic mammography and ultrasound over this area. Patient knows to expect a call from the facility to arrange and will call our office if she does not hear from them.  I discussed the various differential with her to include normal glandular discomfort, small nonpalpable cysts or other more concerning pathology. 4. Pap smear 2014. Pap smear of vaginal cuff today. No history of significant abnormal Pap smears. Options to stop screening per current screening guidelines based on hysterectomy history discussed. Will readdress on annual basis. 5. Colonoscopy 6 years ago with reported repeat interval 10 years. 6. Health maintenance. Will check baseline CBC, CMP, lipid profile, FSH, TSH, vitamin D and urinalysis. Did have a history of vitamin D deficiency last year with recheck in the low normal range.  Is taking a vitamin D supplement.   15 minutes of my time in excess of her routine gynecologic exam was spent in direct face to face counseling and coordination of care in regards to her issues of menopausal  symptoms and treatment options, allergic rhinitis with Nasonex trial and left breast mastalgia with ordered workup.    Dara Lords MD, 3:16 PM 06/28/2016

## 2016-06-28 NOTE — Addendum Note (Signed)
Addended by: Dayna BarkerGARDNER, Maribel Luis K on: 06/28/2016 03:27 PM   Modules accepted: Orders

## 2016-06-29 LAB — URINALYSIS W MICROSCOPIC + REFLEX CULTURE
BACTERIA UA: NONE SEEN [HPF]
Bilirubin Urine: NEGATIVE
Casts: NONE SEEN [LPF]
GLUCOSE, UA: NEGATIVE
Hgb urine dipstick: NEGATIVE
Ketones, ur: NEGATIVE
Leukocytes, UA: NEGATIVE
Nitrite: NEGATIVE
PROTEIN: NEGATIVE
RBC / HPF: NONE SEEN RBC/HPF (ref <=2)
SPECIFIC GRAVITY, URINE: 1.024 (ref 1.001–1.035)
Squamous Epithelial / LPF: NONE SEEN [HPF] (ref <=5)
WBC, UA: NONE SEEN WBC/HPF (ref <=5)
YEAST: NONE SEEN [HPF]
pH: 6.5 (ref 5.0–8.0)

## 2016-06-29 LAB — COMPREHENSIVE METABOLIC PANEL
ALBUMIN: 4.2 g/dL (ref 3.6–5.1)
ALK PHOS: 59 U/L (ref 33–130)
ALT: 36 U/L — ABNORMAL HIGH (ref 6–29)
AST: 39 U/L — ABNORMAL HIGH (ref 10–35)
BILIRUBIN TOTAL: 0.4 mg/dL (ref 0.2–1.2)
BUN: 14 mg/dL (ref 7–25)
CO2: 29 mmol/L (ref 20–31)
CREATININE: 0.88 mg/dL (ref 0.50–1.05)
Calcium: 9.5 mg/dL (ref 8.6–10.4)
Chloride: 103 mmol/L (ref 98–110)
Glucose, Bld: 71 mg/dL (ref 65–99)
Potassium: 4.4 mmol/L (ref 3.5–5.3)
SODIUM: 141 mmol/L (ref 135–146)
TOTAL PROTEIN: 7.2 g/dL (ref 6.1–8.1)

## 2016-06-29 LAB — LIPID PANEL
Cholesterol: 225 mg/dL — ABNORMAL HIGH (ref 125–200)
HDL: 75 mg/dL (ref >=46)
LDL Cholesterol: 124 mg/dL (ref <130)
TRIGLYCERIDES: 128 mg/dL (ref <150)
Total CHOL/HDL Ratio: 3 Ratio (ref <=5.0)
VLDL: 26 mg/dL (ref <30)

## 2016-06-29 LAB — PAP IG W/ RFLX HPV ASCU

## 2016-06-29 LAB — THYROID PROFILE - CHCC
FREE THYROXINE INDEX: 2.6 (ref 1.4–3.8)
T3 Uptake: 33 % (ref 22–35)
T4, Total: 8 ug/dL (ref 4.5–12.0)

## 2016-06-29 LAB — VITAMIN D 25 HYDROXY (VIT D DEFICIENCY, FRACTURES): Vit D, 25-Hydroxy: 31 ng/mL (ref 30–100)

## 2016-06-29 LAB — FOLLICLE STIMULATING HORMONE: FSH: 42.2 m[IU]/mL

## 2016-06-30 ENCOUNTER — Other Ambulatory Visit: Payer: Self-pay | Admitting: Gynecology

## 2016-06-30 DIAGNOSIS — R7989 Other specified abnormal findings of blood chemistry: Secondary | ICD-10-CM

## 2016-06-30 DIAGNOSIS — R945 Abnormal results of liver function studies: Principal | ICD-10-CM

## 2016-06-30 DIAGNOSIS — E7800 Pure hypercholesterolemia, unspecified: Secondary | ICD-10-CM

## 2016-07-12 ENCOUNTER — Telehealth: Payer: Self-pay | Admitting: *Deleted

## 2016-07-12 DIAGNOSIS — N644 Mastodynia: Secondary | ICD-10-CM

## 2016-07-12 NOTE — Telephone Encounter (Signed)
Pt called stating she never hear from our office regarding diag imaging for breast. I explained to pt I never received this information to schedule. Per note on 06/28/16 pt was c/o left breast pain and TF recommended diagnostic mammogram and ultrasound.   Appointment on 07/14/16 @ 9:15am left detailed message on pt voicemail per DPR access.

## 2016-07-14 ENCOUNTER — Other Ambulatory Visit: Payer: BLUE CROSS/BLUE SHIELD

## 2016-07-19 ENCOUNTER — Ambulatory Visit
Admission: RE | Admit: 2016-07-19 | Discharge: 2016-07-19 | Disposition: A | Discharge status: Home / Self Care | Payer: BLUE CROSS/BLUE SHIELD | Source: Ambulatory Visit | Attending: Gynecology | Admitting: Gynecology

## 2016-07-19 DIAGNOSIS — N644 Mastodynia: Secondary | ICD-10-CM

## 2016-07-30 ENCOUNTER — Other Ambulatory Visit: Payer: Self-pay

## 2016-07-30 ENCOUNTER — Encounter: Payer: BLUE CROSS/BLUE SHIELD | Admitting: Gynecology

## 2016-08-13 ENCOUNTER — Other Ambulatory Visit: Payer: BLUE CROSS/BLUE SHIELD

## 2016-08-19 ENCOUNTER — Other Ambulatory Visit: Payer: BLUE CROSS/BLUE SHIELD

## 2016-08-19 DIAGNOSIS — E7800 Pure hypercholesterolemia, unspecified: Secondary | ICD-10-CM

## 2016-08-19 DIAGNOSIS — R945 Abnormal results of liver function studies: Principal | ICD-10-CM

## 2016-08-19 DIAGNOSIS — R7989 Other specified abnormal findings of blood chemistry: Secondary | ICD-10-CM

## 2016-08-19 LAB — LIPID PANEL
Cholesterol: 192 mg/dL (ref <200)
HDL: 58 mg/dL (ref >50)
LDL CALC: 121 mg/dL — AB
Total CHOL/HDL Ratio: 3.3 Ratio (ref <5.0)
Triglycerides: 64 mg/dL (ref <150)
VLDL: 13 mg/dL (ref <30)

## 2016-08-19 LAB — COMPREHENSIVE METABOLIC PANEL
ALK PHOS: 54 U/L (ref 33–130)
ALT: 22 U/L (ref 6–29)
AST: 25 U/L (ref 10–35)
Albumin: 4.2 g/dL (ref 3.6–5.1)
BILIRUBIN TOTAL: 0.3 mg/dL (ref 0.2–1.2)
BUN: 18 mg/dL (ref 7–25)
CO2: 25 mmol/L (ref 20–31)
Calcium: 9.4 mg/dL (ref 8.6–10.4)
Chloride: 105 mmol/L (ref 98–110)
Creat: 0.91 mg/dL (ref 0.50–1.05)
GLUCOSE: 96 mg/dL (ref 65–99)
Potassium: 4.3 mmol/L (ref 3.5–5.3)
SODIUM: 141 mmol/L (ref 135–146)
Total Protein: 7.2 g/dL (ref 6.1–8.1)

## 2017-07-01 ENCOUNTER — Encounter: Payer: BLUE CROSS/BLUE SHIELD | Admitting: Gynecology

## 2017-07-08 ENCOUNTER — Ambulatory Visit (INDEPENDENT_AMBULATORY_CARE_PROVIDER_SITE_OTHER): Payer: BLUE CROSS/BLUE SHIELD | Admitting: Gynecology

## 2017-07-08 ENCOUNTER — Encounter: Payer: Self-pay | Admitting: Gynecology

## 2017-07-08 VITALS — BP 120/76 | Ht 61.0 in | Wt 137.0 lb

## 2017-07-08 DIAGNOSIS — R635 Abnormal weight gain: Secondary | ICD-10-CM | POA: Diagnosis not present

## 2017-07-08 DIAGNOSIS — Z13220 Encounter for screening for lipoid disorders: Secondary | ICD-10-CM

## 2017-07-08 DIAGNOSIS — N952 Postmenopausal atrophic vaginitis: Secondary | ICD-10-CM | POA: Diagnosis not present

## 2017-07-08 DIAGNOSIS — Z01411 Encounter for gynecological examination (general) (routine) with abnormal findings: Secondary | ICD-10-CM | POA: Diagnosis not present

## 2017-07-08 NOTE — Patient Instructions (Signed)
Follow up for fasting blood work Follow up in one year for annual exam 

## 2017-07-08 NOTE — Progress Notes (Addendum)
    Stacey Wagner [birth year] [id number]        51 y.o.  G2P2002 for annual gynecologic exam.    Past medical history,surgical history, problem list, medications, allergies, family history and social history were all reviewed and documented as reviewed in the EPIC chart.  ROS:  Performed with pertinent positives and negatives included in the history, assessment and plan.   Additional significant findings :  None   Exam: Kennon Portela assistant Vitals:   07/08/17 1428  BP: 120/76  Weight: 137 lb (62.1 kg)  Height:  (1.549 m)   Body mass index is 25.89 kg/m.  General appearance:  Normal affect, orientation and appearance. Skin: Grossly normal HEENT: Without gross lesions.  No cervical or supraclavicular adenopathy. Thyroid normal.  Lungs:  Clear without wheezing, rales or rhonchi Cardiac: RR, without RMG Abdominal:  Soft, nontender, without masses, guarding, rebound, organomegaly or hernia Breasts:  Examined lying and sitting without masses, retractions, discharge or axillary adenopathy. Pelvic:  Ext, BUS, Vagina: With atrophic changes  Adnexa: Without masses or tenderness    Anus and perineum: Normal   Rectovaginal: Normal sphincter tone without palpated masses or tenderness.    Assessment/Plan:  51 y.o. [id number] female for annual gynecologic exam, status post TAH 2009 for leiomyoma.   1. Postmenopausal/atrophic genital changes. Doing well without significant hot flushes, night sweats or vaginal dryness. Is having issues with dry skin and feeling dry all over. I discussed HRT with her not only addressing classic menopausal symptoms such as night sweats and hot flushes but also less well established issues such as fatigue, feeling of well-being, arthralgias/myalgias and dryness. I discussed was involved with HRT and the risks to include thrombosis such as stroke heart attack DVT and the breast cancer issue. At this point patient is not interested in trying anything. Will follow up  if she reconsiders. 2. Mammography coming due in October and I reminded her to schedule this. Breast exam normal today. 3. Pap smear 2017. No Pap smear done today. No history of abnormal Pap smears. Options to stop screening per current screening guidelines based on hysterectomy history reviewed. Will readdress on annual basis. 4. Colonoscopy 7 years ago. Repeat interval reported at 10 years. 5. Health maintenance. Currently not fasting. Future orders placed for CBC, CMP, lipid profile, vitamin D. Also ordered TSH due to complaints of difficulty losing weight with some weight gain over the last several years. Notes that she is not exercising as much is previously. Follow up in one year, sooner as needed.   Dara Lords MD, 2:56 PM 07/08/2017

## 2017-07-25 ENCOUNTER — Other Ambulatory Visit: Payer: BLUE CROSS/BLUE SHIELD

## 2017-07-25 DIAGNOSIS — R635 Abnormal weight gain: Secondary | ICD-10-CM

## 2017-07-25 DIAGNOSIS — Z13220 Encounter for screening for lipoid disorders: Secondary | ICD-10-CM

## 2017-07-25 DIAGNOSIS — Z01411 Encounter for gynecological examination (general) (routine) with abnormal findings: Secondary | ICD-10-CM

## 2017-07-26 LAB — COMPREHENSIVE METABOLIC PANEL
AG RATIO: 1.5 (calc) (ref 1.0–2.5)
ALBUMIN MSPROF: 4.4 g/dL (ref 3.6–5.1)
ALKALINE PHOSPHATASE (APISO): 82 U/L (ref 33–130)
ALT: 32 U/L — ABNORMAL HIGH (ref 6–29)
AST: 33 U/L (ref 10–35)
BUN: 17 mg/dL (ref 7–25)
CHLORIDE: 104 mmol/L (ref 98–110)
CO2: 30 mmol/L (ref 20–32)
Calcium: 9.6 mg/dL (ref 8.6–10.4)
Creat: 0.82 mg/dL (ref 0.50–1.05)
GLOBULIN: 3 g/dL (ref 1.9–3.7)
Glucose, Bld: 95 mg/dL (ref 65–99)
POTASSIUM: 4.3 mmol/L (ref 3.5–5.3)
Sodium: 141 mmol/L (ref 135–146)
Total Bilirubin: 0.6 mg/dL (ref 0.2–1.2)
Total Protein: 7.4 g/dL (ref 6.1–8.1)

## 2017-07-26 LAB — CBC WITH DIFFERENTIAL/PLATELET
BASOS PCT: 0.7 %
Basophils Absolute: 41 cells/uL (ref 0–200)
EOS ABS: 254 {cells}/uL (ref 15–500)
Eosinophils Relative: 4.3 %
HCT: 39.5 % (ref 35.0–45.0)
HEMOGLOBIN: 13.6 g/dL (ref 11.7–15.5)
Lymphs Abs: 2100 cells/uL (ref 850–3900)
MCH: 31.4 pg (ref 27.0–33.0)
MCHC: 34.4 g/dL (ref 32.0–36.0)
MCV: 91.2 fL (ref 80.0–100.0)
MPV: 9.8 fL (ref 7.5–12.5)
Monocytes Relative: 6.5 %
NEUTROS ABS: 3121 {cells}/uL (ref 1500–7800)
Neutrophils Relative %: 52.9 %
PLATELETS: 280 10*3/uL (ref 140–400)
RBC: 4.33 10*6/uL (ref 3.80–5.10)
RDW: 11.9 % (ref 11.0–15.0)
Total Lymphocyte: 35.6 %
WBC mixed population: 384 cells/uL (ref 200–950)
WBC: 5.9 10*3/uL (ref 3.8–10.8)

## 2017-07-26 LAB — LIPID PANEL
CHOL/HDL RATIO: 3.1 (calc) (ref <5.0)
Cholesterol: 209 mg/dL — ABNORMAL HIGH (ref <200)
HDL: 68 mg/dL (ref >50)
LDL Cholesterol (Calc): 121 mg/dL (calc) — ABNORMAL HIGH
NON-HDL CHOLESTEROL (CALC): 141 mg/dL — AB (ref <130)
Triglycerides: 101 mg/dL (ref <150)

## 2017-07-26 LAB — VITAMIN D 25 HYDROXY (VIT D DEFICIENCY, FRACTURES): Vit D, 25-Hydroxy: 30 ng/mL (ref 30–100)

## 2017-07-26 LAB — TSH: TSH: 1.12 mIU/L

## 2018-07-07 ENCOUNTER — Other Ambulatory Visit: Payer: Self-pay | Admitting: Gynecology

## 2018-07-07 DIAGNOSIS — Z1231 Encounter for screening mammogram for malignant neoplasm of breast: Secondary | ICD-10-CM

## 2018-08-04 ENCOUNTER — Ambulatory Visit
Admission: RE | Admit: 2018-08-04 | Discharge: 2018-08-04 | Disposition: A | Discharge status: Home / Self Care | Payer: BLUE CROSS/BLUE SHIELD | Source: Ambulatory Visit | Attending: Gynecology | Admitting: Gynecology

## 2018-08-04 DIAGNOSIS — Z1231 Encounter for screening mammogram for malignant neoplasm of breast: Secondary | ICD-10-CM

## 2018-09-22 ENCOUNTER — Ambulatory Visit: Payer: BLUE CROSS/BLUE SHIELD | Admitting: Gynecology

## 2018-09-22 ENCOUNTER — Encounter: Payer: Self-pay | Admitting: Gynecology

## 2018-09-22 VITALS — BP 118/76 | Ht 60.0 in | Wt 131.0 lb

## 2018-09-22 DIAGNOSIS — E559 Vitamin D deficiency, unspecified: Secondary | ICD-10-CM

## 2018-09-22 DIAGNOSIS — Z833 Family history of diabetes mellitus: Secondary | ICD-10-CM

## 2018-09-22 DIAGNOSIS — Z13220 Encounter for screening for lipoid disorders: Secondary | ICD-10-CM

## 2018-09-22 DIAGNOSIS — Z01419 Encounter for gynecological examination (general) (routine) without abnormal findings: Secondary | ICD-10-CM

## 2018-09-22 NOTE — Progress Notes (Signed)
    Stacey Wagner 05/08/1966 [id number]        52 y.o.  G2P2002 for annual gynecologic exam.  Without gynecologic complaints  Past medical history,surgical history, problem list, medications, allergies, family history and social history were all reviewed and documented as reviewed in the EPIC chart.  ROS:  Performed with pertinent positives and negatives included in the history, assessment and plan.   Additional significant findings : None   Exam: Kennon PortelaKim Gardner assistant Vitals:   09/22/18 0833  BP: 118/76  Weight: 131 lb (59.4 kg)  Height: 5' (1.524 m)   Body mass index is 25.58 kg/m.  General appearance:  Normal affect, orientation and appearance. Skin: Grossly normal HEENT: Without gross lesions.  No cervical or supraclavicular adenopathy. Thyroid normal.  Lungs:  Clear without wheezing, rales or rhonchi Cardiac: RR, without RMG Abdominal:  Soft, nontender, without masses, guarding, rebound, organomegaly or hernia Breasts:  Examined lying and sitting without masses, retractions, discharge or axillary adenopathy. Pelvic:  Ext, BUS, Vagina: Normal with atrophic changes.  Pap smear of vaginal cuff done  Adnexa: Without masses or tenderness    Anus and perineum: Normal   Rectovaginal: Normal sphincter tone without palpated masses or tenderness.    Assessment/Plan:  52 y.o. 232P2002 female for annual gynecologic exam status post TAH 2009 for leiomyoma.   1. Postmenopausal.  No significant menopausal symptoms. 2. Mammography 07/2018.  Continue with annual mammography when due.  Breast exam normal today. 3. Pap smear 2017.  Pap smear of vaginal cuff done today.  No history of abnormal Pap smears.  Options to stop screening per current screening guidelines reviewed.  Will readdress on an annual basis. 4. Colonoscopy 8 years ago.  Recommended repeat interval 10 years. 5. Health maintenance.  Family history of diabetes in her mother.  Will check baseline CBC, CMP, hemoglobin A1c, lipid  profile and vitamin D level as she has a history of vitamin D deficiency.  TSH normal last year and not repeated.  Follow-up 1 year, sooner as needed.   Dara Lordsimothy P Fontaine MD, 8:57 AM 09/22/2018

## 2018-09-22 NOTE — Patient Instructions (Signed)
Follow-up in 1 year for annual exam, sooner if any issues. 

## 2018-09-22 NOTE — Addendum Note (Signed)
Addended by: Dayna BarkerGARDNER, Judie Hollick K on: 09/22/2018 09:02 AM   Modules accepted: Orders

## 2018-09-23 LAB — CBC WITH DIFFERENTIAL/PLATELET
Basophils Absolute: 70 cells/uL (ref 0–200)
Basophils Relative: 1.4 %
Eosinophils Absolute: 140 cells/uL (ref 15–500)
Eosinophils Relative: 2.8 %
HCT: 38.8 % (ref 35.0–45.0)
Hemoglobin: 13.1 g/dL (ref 11.7–15.5)
Lymphs Abs: 2000 cells/uL (ref 850–3900)
MCH: 30.8 pg (ref 27.0–33.0)
MCHC: 33.8 g/dL (ref 32.0–36.0)
MCV: 91.3 fL (ref 80.0–100.0)
MPV: 9.8 fL (ref 7.5–12.5)
Monocytes Relative: 6.6 %
NEUTROS ABS: 2460 {cells}/uL (ref 1500–7800)
Neutrophils Relative %: 49.2 %
Platelets: 319 10*3/uL (ref 140–400)
RBC: 4.25 10*6/uL (ref 3.80–5.10)
RDW: 11.8 % (ref 11.0–15.0)
Total Lymphocyte: 40 %
WBC mixed population: 330 cells/uL (ref 200–950)
WBC: 5 10*3/uL (ref 3.8–10.8)

## 2018-09-23 LAB — COMPREHENSIVE METABOLIC PANEL
AG RATIO: 1.4 (calc) (ref 1.0–2.5)
ALT: 27 U/L (ref 6–29)
AST: 27 U/L (ref 10–35)
Albumin: 4.2 g/dL (ref 3.6–5.1)
Alkaline phosphatase (APISO): 67 U/L (ref 33–130)
BUN: 17 mg/dL (ref 7–25)
CO2: 29 mmol/L (ref 20–32)
Calcium: 9.3 mg/dL (ref 8.6–10.4)
Chloride: 103 mmol/L (ref 98–110)
Creat: 0.84 mg/dL (ref 0.50–1.05)
GLUCOSE: 98 mg/dL (ref 65–99)
Globulin: 3 g/dL (calc) (ref 1.9–3.7)
Potassium: 4.3 mmol/L (ref 3.5–5.3)
Sodium: 139 mmol/L (ref 135–146)
Total Bilirubin: 0.5 mg/dL (ref 0.2–1.2)
Total Protein: 7.2 g/dL (ref 6.1–8.1)

## 2018-09-23 LAB — LIPID PANEL
Cholesterol: 207 mg/dL — ABNORMAL HIGH (ref <200)
HDL: 56 mg/dL (ref >50)
LDL Cholesterol (Calc): 134 mg/dL (calc) — ABNORMAL HIGH
Non-HDL Cholesterol (Calc): 151 mg/dL (calc) — ABNORMAL HIGH (ref <130)
TRIGLYCERIDES: 78 mg/dL (ref <150)
Total CHOL/HDL Ratio: 3.7 (calc) (ref <5.0)

## 2018-09-23 LAB — VITAMIN D 25 HYDROXY (VIT D DEFICIENCY, FRACTURES): Vit D, 25-Hydroxy: 35 ng/mL (ref 30–100)

## 2018-09-23 LAB — HEMOGLOBIN A1C
HEMOGLOBIN A1C: 5.8 %{Hb} — AB (ref <5.7)
MEAN PLASMA GLUCOSE: 120 (calc)
eAG (mmol/L): 6.6 (calc)

## 2018-09-25 ENCOUNTER — Encounter: Payer: Self-pay | Admitting: Gynecology

## 2018-09-25 LAB — PAP IG W/ RFLX HPV ASCU

## 2019-07-04 ENCOUNTER — Encounter: Payer: Self-pay | Admitting: Gynecology

## 2019-10-22 ENCOUNTER — Ambulatory Visit: Payer: BC Managed Care – PPO | Attending: Internal Medicine

## 2019-11-19 ENCOUNTER — Ambulatory Visit: Payer: BC Managed Care – PPO

## 2020-08-15 ENCOUNTER — Encounter: Payer: Self-pay | Admitting: Obstetrics and Gynecology

## 2020-08-15 ENCOUNTER — Other Ambulatory Visit: Payer: Self-pay

## 2020-08-15 ENCOUNTER — Ambulatory Visit: Payer: BC Managed Care – PPO | Admitting: Obstetrics and Gynecology

## 2020-08-15 VITALS — BP 116/74

## 2020-08-15 DIAGNOSIS — R1031 Right lower quadrant pain: Secondary | ICD-10-CM

## 2020-08-15 NOTE — Progress Notes (Signed)
Stacey Wagner [birth year] [id number]  SUBJECTIVE:  54 y.o. G2P2002 female presents for evaluation of right lower quadrant pain.  This has been present in the past few months, intermittent in frequency and getting less frequent.  She describes as a deep and sharp pressure, only isolated to the right side without radiation.  The pain has periodically been bad enough to cause her stop what she is doing.  The pain now is almost completely diminished and she no longer has the severe pain episodes.  No associated nausea or vomiting.  Not associated with bowel movements.  Says her bowel movements are pretty regular.  No blood in her stool.  She has had a previous colonoscopy and will be having another screening colonoscopy soon as it was rescheduled from during the pandemic.  Prior hysterectomy for failed endometrial ablation.  Pap smears have been normal during her lifetime including after the hysterectomy.  Most recent normal Pap smear is 09/2018.  Has noted slightly more urinary urgency at times but no dysuria or painful urination symptoms.  Her sister is currently being treated for cervical cancer and already had surgery and now is undergoing radiation treatments.  Other extended family history is somewhat limited as a lot of the rest of her family [place] in the [place]   Current Outpatient Medications  Medication Sig Dispense Refill  . BIOTIN PO Take 1 tablet by mouth daily.     Marland Kitchen CALCIUM PO Take by mouth.    . Cetirizine HCl (ZYRTEC PO) Take by mouth.    . Cholecalciferol (VITAMIN D PO) Take 1 tablet by mouth daily.     . Probiotic Product (PROBIOTIC PO) Take by mouth.    . TURMERIC PO Take by mouth.     No current facility-administered medications for this visit.   Allergies: Cephalexin and Penicillins  No LMP recorded. Patient has had a hysterectomy.  Past medical history,surgical history, problem list, medications, allergies, family history and social history were all reviewed and  documented as reviewed in the EPIC chart.  ROS: Pertinent positives and negatives as reviewed in the HPI.  OBJECTIVE:  BP 116/74  The patient appears well, alert, oriented x 3, in no distress. ABDOMEN: Soft, nontender, nondistended.  Palpation over the lateral inferior aspect of the pubic bone causes some discomfort. PELVIC EXAM: VULVA: normal appearing vulva with no masses, tenderness or lesions, VAGINA: normal appearing vagina with normal color and discharge, no lesions, CERVIX: surgically absent, UTERUS: surgically absent, vaginal cuff normal, ADNEXA: Left adnexa is normal in size, nontender and no masses.  Right adnexal is difficult to evaluate because of patient discomfort with pressing the abdominal wall in this area.  Chaperone: Kennon Portela present during the examination  ASSESSMENT:  54 y.o. [phone] here for evaluation of right lower quadrant pain  PLAN:  I suspect her pain is musculoskeletal as it seems to be noted on palpation of fairly superficial musculature and ligament structures in the right pubic bone region.  I think the discomfort on her right adnexal exam is from the pushing of the abdominal wall and aggravating the irritated musculoskeletal structures.  I do not think she has appendicitis based on her unremarkable abdominal exam.  Could be bowel related but not as common for this kind of pain to be isolated to just the right side only without migration.  We discussed urinary urgency and association of menopause.  If those symptoms become more bothersome then she could certainly discuss treatment with [place], but I do  not think that has anything to do with her RLQ pain.  Just to more definitively rule out adnexal pathology I have recommended a pelvic ultrasound which she will plan to schedule upon checkout today.   Theresia Majors MD 08/15/20

## 2020-09-11 ENCOUNTER — Encounter: Payer: Self-pay | Admitting: Obstetrics and Gynecology

## 2020-09-11 ENCOUNTER — Ambulatory Visit: Payer: BC Managed Care – PPO | Admitting: Obstetrics and Gynecology

## 2020-09-11 ENCOUNTER — Ambulatory Visit (INDEPENDENT_AMBULATORY_CARE_PROVIDER_SITE_OTHER): Payer: BC Managed Care – PPO

## 2020-09-11 ENCOUNTER — Other Ambulatory Visit: Payer: Self-pay

## 2020-09-11 VITALS — BP 122/80

## 2020-09-11 DIAGNOSIS — R1031 Right lower quadrant pain: Secondary | ICD-10-CM

## 2020-09-11 DIAGNOSIS — N951 Menopausal and female climacteric states: Secondary | ICD-10-CM | POA: Diagnosis not present

## 2020-09-11 MED ORDER — ESTRADIOL 10 MCG VA TABS: 1.0000 | ORAL_TABLET | VAGINAL | 3 refills | Status: DC

## 2020-09-11 NOTE — Progress Notes (Signed)
   Stacey Wagner June 02, 1966 [id number]  SUBJECTIVE:  54 y.o. G2P2002 female presents for a pelvic ultrasound to evaluate right lower quadrant pain for which she was evaluated 08/15/2020.  In the interim since we discussed possible musculoskeletal component, she has discovered that with stopping jogging and reverting to walking only the pain has mostly resolved.  Also she would like to discuss vaginal dryness.  Vasomotor symptoms such as hot flashes are present but becoming more mild with time.  Current Outpatient Medications  Medication Sig Dispense Refill  . BIOTIN PO Take 1 tablet by mouth daily.     Marland Kitchen CALCIUM PO Take by mouth.    . Cetirizine HCl (ZYRTEC PO) Take by mouth.    . Cholecalciferol (VITAMIN D PO) Take 1 tablet by mouth daily.     . Probiotic Product (PROBIOTIC PO) Take by mouth.    . TURMERIC PO Take by mouth.     No current facility-administered medications for this visit.   Allergies: Cephalexin and Penicillins  No LMP recorded. Patient has had a hysterectomy.  Past medical history,surgical history, problem list, medications, allergies, family history and social history were all reviewed and documented as reviewed in the EPIC chart.  ROS: Pertinent positives and negatives as reviewed in HPI  OBJECTIVE:  BP 122/80 (BP Location: Right Arm, Patient Position: Sitting, Cuff Size: Normal)  The patient appears well, alert, oriented, in no distress.  Pelvic ultrasound Surgically absent uterus.  Normal vaginal cuff. Bilateral ovaries are normal, right ovary with 3 mm of follicle. No adnexal mass.  No free fluid.   ASSESSMENT:  54 y.o. [id number] here for pelvic ultrasound to evaluate right lower quadrant pain, discussion of vaginal dryness in menopause  PLAN:  1.  RLQ pain.  Patient is reassured that gynecologic source is not thought to be likely based on negative pelvic ultrasound imaging today.  Curious to note that a change in her physical activity pattern has resulted in  improvement in the discomfort.  I suspect she could have a muscle strain or related musculoskeletal condition, and she also admits to not drinking enough water consistently.  Addressing that and also ensuring that she does do stretches prior to walking, if she has returned to those kinds of symptoms than she should see physical therapy for evaluation and treatment. 2.  Discussed vaginal dryness and menopause due to estrogen deficiency.  Discussed possible treatments including lubricants and topical estrogens.  She would like to try estrogen.  We discussed cream versus tablets or Estring.  She was most interested in trying tablets so I sent her a prescription for Vagifem tablets 10 mcg twice weekly with a 1 year supply.  She is aware of the risks of estrogens which are reviewed to include thrombotic diseases such as heart attack, stroke, DVT, PE and the breast cancer issue.  Minimalized risk with use of vaginal estrogens as directed.  Will follow-up if there are no improvement in her symptoms after the next several months.  Theresia Majors MD 09/11/20

## 2020-09-18 IMAGING — MG DIGITAL SCREENING BILATERAL MAMMOGRAM WITH TOMO AND CAD
8 series · 9 of 24 positions shown · non-contrast
Comparison: Previous exam(s).

CLINICAL DATA: Screening.

EXAM:
DIGITAL SCREENING BILATERAL MAMMOGRAM WITH TOMO AND CAD

[R CC synth-2D]
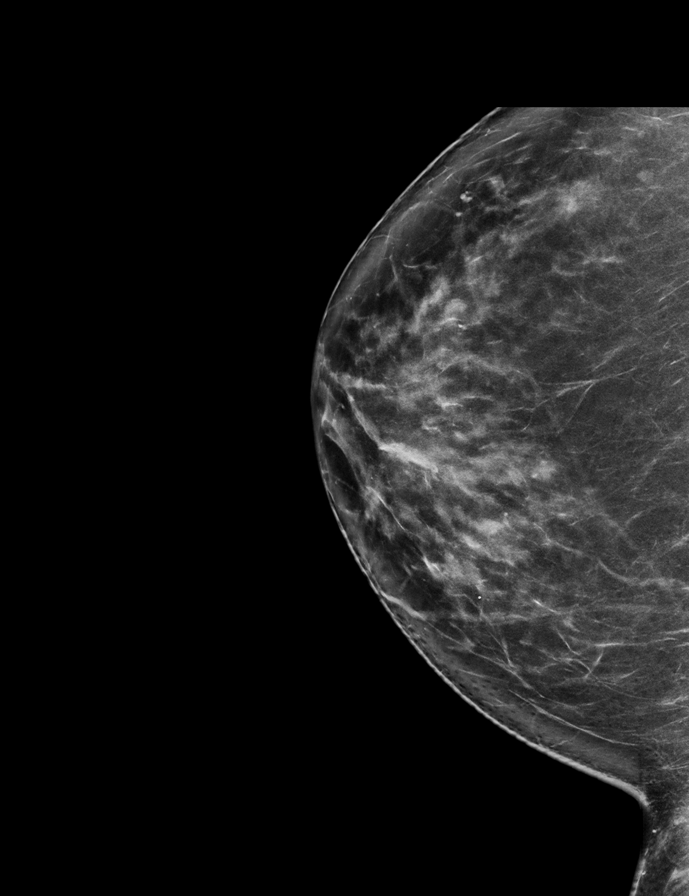

[L CC synth-2D]
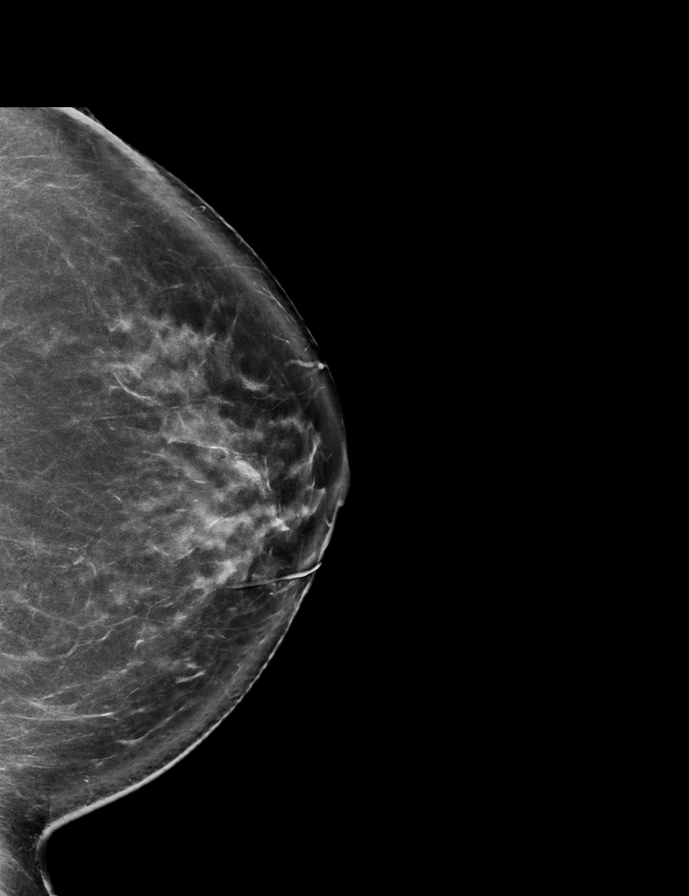

[R MLO synth-2D]
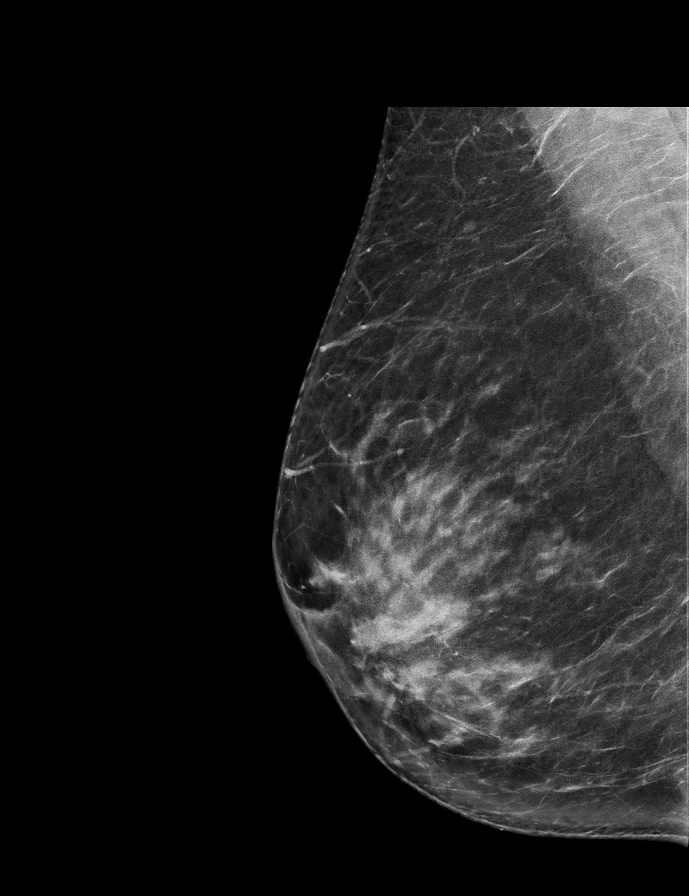

[L MLO synth-2D]
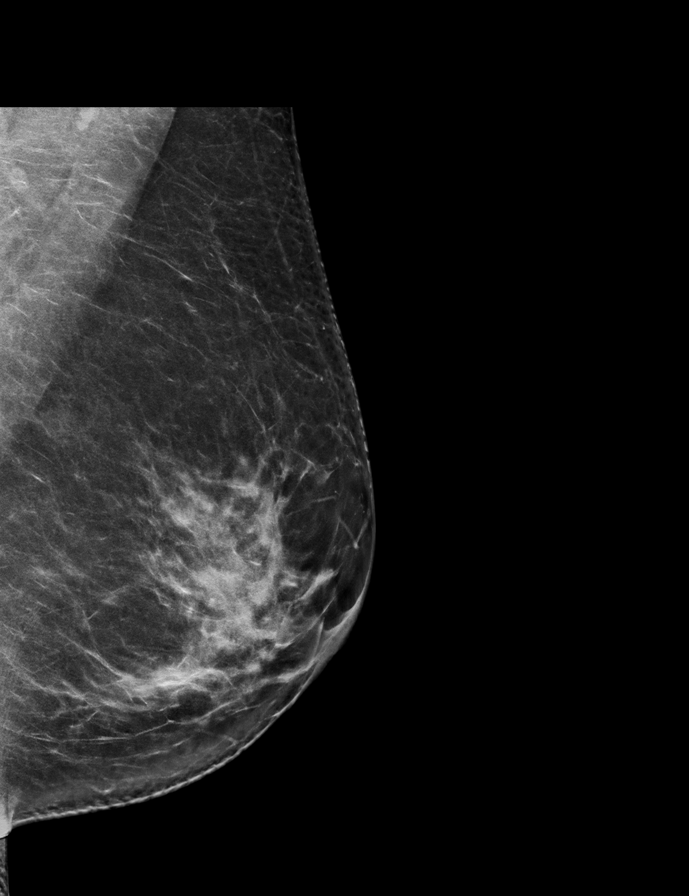

[L MLO tomo · 2 of 81 frames shown]
[frame 27/81]
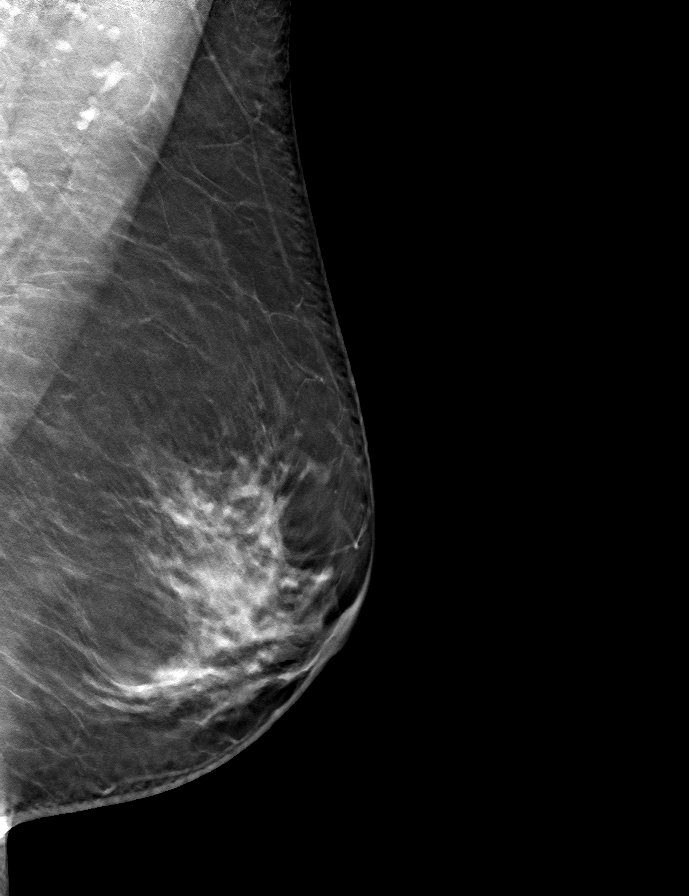
[frame 41/81]
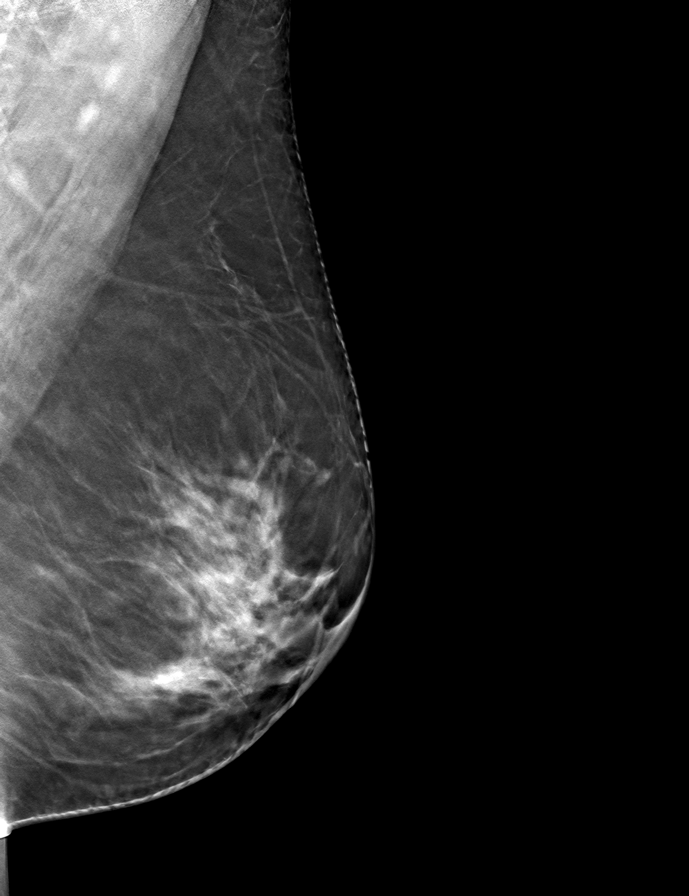

[L CC tomo · tomo slice 44/87.0]
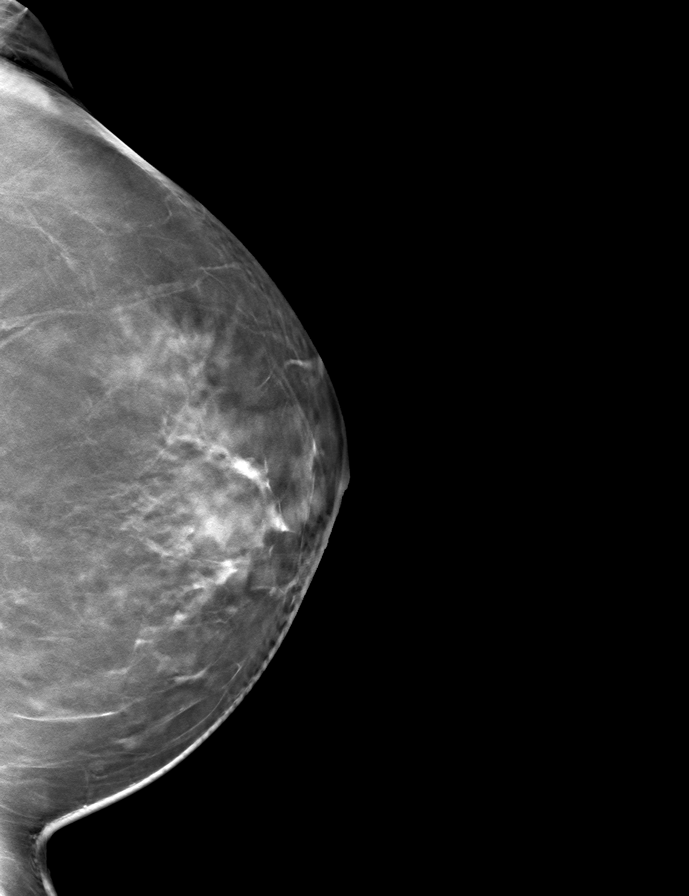

[R MLO tomo · tomo slice 41/80.0]
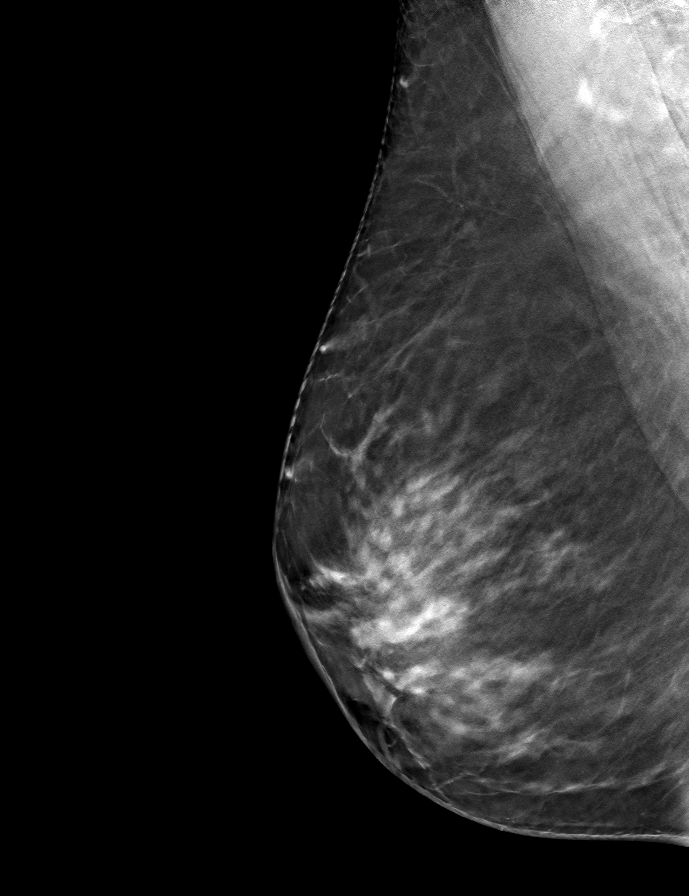

[R CC tomo · tomo slice 41/82.0]
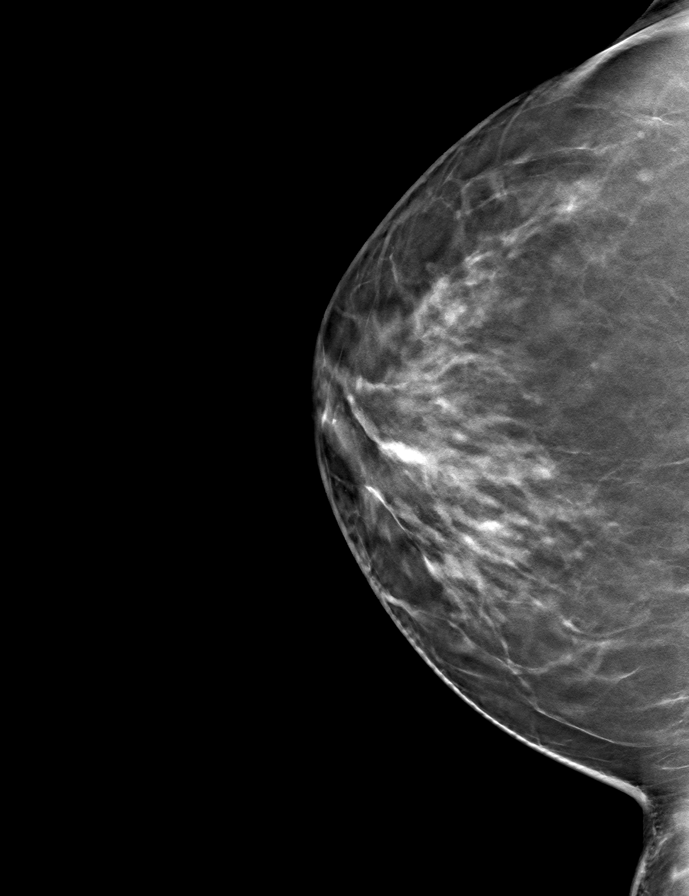

[9 of 24 positions shown; findings below may reference images not displayed]

ACR Breast Density Category c: The breast tissue is heterogeneously
dense, which may obscure small masses.
FINDINGS: There are no findings suspicious for malignancy. Images were
processed with CAD.
IMPRESSION: No mammographic evidence of malignancy. A result letter of this
screening mammogram will be mailed directly to the patient.

RECOMMENDATION:
Screening mammogram in one year. (Code:FT-U-LHB)

BI-RADS CATEGORY  1: Negative.

## 2021-11-20 DIAGNOSIS — L2089 Other atopic dermatitis: Secondary | ICD-10-CM | POA: Diagnosis not present

## 2022-01-01 DIAGNOSIS — Z23 Encounter for immunization: Secondary | ICD-10-CM | POA: Diagnosis not present

## 2022-03-24 DIAGNOSIS — Z1211 Encounter for screening for malignant neoplasm of colon: Secondary | ICD-10-CM | POA: Diagnosis not present

## 2022-03-24 DIAGNOSIS — D125 Benign neoplasm of sigmoid colon: Secondary | ICD-10-CM | POA: Diagnosis not present

## 2022-03-24 DIAGNOSIS — K648 Other hemorrhoids: Secondary | ICD-10-CM | POA: Diagnosis not present

## 2022-05-19 ENCOUNTER — Other Ambulatory Visit: Payer: Self-pay | Admitting: Family Medicine

## 2022-05-19 DIAGNOSIS — Z1231 Encounter for screening mammogram for malignant neoplasm of breast: Secondary | ICD-10-CM

## 2022-05-21 ENCOUNTER — Other Ambulatory Visit: Payer: Self-pay | Admitting: Family Medicine

## 2022-05-21 DIAGNOSIS — N644 Mastodynia: Secondary | ICD-10-CM

## 2022-06-07 DIAGNOSIS — D485 Neoplasm of uncertain behavior of skin: Secondary | ICD-10-CM | POA: Diagnosis not present

## 2022-06-07 DIAGNOSIS — L2089 Other atopic dermatitis: Secondary | ICD-10-CM | POA: Diagnosis not present

## 2022-07-02 ENCOUNTER — Ambulatory Visit
Admission: RE | Admit: 2022-07-02 | Discharge: 2022-07-02 | Disposition: A | Discharge status: Home / Self Care | Payer: BC Managed Care – PPO | Source: Ambulatory Visit | Attending: Family Medicine | Admitting: Family Medicine

## 2022-07-02 ENCOUNTER — Ambulatory Visit: Payer: BC Managed Care – PPO

## 2022-07-02 ENCOUNTER — Other Ambulatory Visit: Payer: Self-pay | Admitting: Family Medicine

## 2022-07-02 DIAGNOSIS — N644 Mastodynia: Secondary | ICD-10-CM | POA: Diagnosis not present

## 2022-09-24 DIAGNOSIS — J069 Acute upper respiratory infection, unspecified: Secondary | ICD-10-CM | POA: Diagnosis not present

## 2022-09-24 DIAGNOSIS — Z03818 Encounter for observation for suspected exposure to other biological agents ruled out: Secondary | ICD-10-CM | POA: Diagnosis not present

## 2022-09-24 DIAGNOSIS — R059 Cough, unspecified: Secondary | ICD-10-CM | POA: Diagnosis not present

## 2022-10-22 DIAGNOSIS — Z0000 Encounter for general adult medical examination without abnormal findings: Secondary | ICD-10-CM | POA: Diagnosis not present

## 2022-10-22 DIAGNOSIS — Z13220 Encounter for screening for lipoid disorders: Secondary | ICD-10-CM | POA: Diagnosis not present

## 2022-10-22 DIAGNOSIS — Z23 Encounter for immunization: Secondary | ICD-10-CM | POA: Diagnosis not present

## 2022-10-22 DIAGNOSIS — E049 Nontoxic goiter, unspecified: Secondary | ICD-10-CM | POA: Diagnosis not present

## 2022-12-03 DIAGNOSIS — R21 Rash and other nonspecific skin eruption: Secondary | ICD-10-CM | POA: Diagnosis not present

## 2022-12-03 DIAGNOSIS — L0390 Cellulitis, unspecified: Secondary | ICD-10-CM | POA: Diagnosis not present

## 2023-03-18 DIAGNOSIS — N951 Menopausal and female climacteric states: Secondary | ICD-10-CM | POA: Diagnosis not present

## 2023-05-20 ENCOUNTER — Ambulatory Visit: Payer: BC Managed Care – PPO | Admitting: Radiology

## 2023-05-20 ENCOUNTER — Encounter: Payer: Self-pay | Admitting: Radiology

## 2023-05-20 VITALS — BP 132/80

## 2023-05-20 DIAGNOSIS — E7801 Familial hypercholesterolemia: Secondary | ICD-10-CM | POA: Diagnosis not present

## 2023-05-20 DIAGNOSIS — N952 Postmenopausal atrophic vaginitis: Secondary | ICD-10-CM | POA: Diagnosis not present

## 2023-05-20 DIAGNOSIS — E7800 Pure hypercholesterolemia, unspecified: Secondary | ICD-10-CM | POA: Diagnosis not present

## 2023-05-20 DIAGNOSIS — Z780 Asymptomatic menopausal state: Secondary | ICD-10-CM

## 2023-05-20 MED ORDER — IMVEXXY MAINTENANCE PACK 10 MCG VA INST
1.0000 | VAGINAL_INSERT | VAGINAL | 11 refills | Status: DC
Start: 2023-05-23 — End: 2024-05-03

## 2023-05-20 MED ORDER — ESTRADIOL 0.5 MG/0.5GM TD GEL
1.0000 | Freq: Every day | TRANSDERMAL | 0 refills | Status: DC
Start: 2023-05-20 — End: 2023-06-24

## 2023-05-20 NOTE — Progress Notes (Signed)
   Stacey Wagner [birth year] [id number]   History:  57 y.o. G2P2 here to discuss hormone replacement therapy. Complains of hot flashes, fatigue, trouble sleeping, joint pains, fogginess, vaginal dryness, painful intercourse. Taking over the counter Estroven x's 1 month--slight improvement in symptoms. Hysterectomy--has bilateral ovaries.   Last AEX @ PCP 10/2022  P 10/28/22 normal M 07/04/22 normal C 03/24/22   Gynecologic History Hysterectomy: 2009 fibroids  Sexually active: yes   Past medical history, past surgical history, family history and social history were all reviewed and documented in the EPIC chart.  ROS:  A ROS was performed and pertinent positives and negatives are included.  Exam:  Vitals:   05/20/23 1109  BP: 132/80   There is no height or weight on file to calculate BMI.  Physical Exam Constitutional:      Appearance: Normal appearance. She is normal weight.  Pulmonary:     Effort: Pulmonary effort is normal.  Neurological:     Mental Status: She is alert.  Psychiatric:        Mood and Affect: Mood normal.        Thought Content: Thought content normal.        Judgment: Judgment normal.      Assessment/Plan:   1. Menopause Risks and benefits reviewed with patient - Estradiol 0.5 MG/0.5GM GEL; Place 1 Package onto the skin daily.  Dispense: 84 each; Refill: 0  2. Vaginal atrophy - Estradiol (IMVEXXY MAINTENANCE PACK) 10 MCG INST; Place 1 tablet vaginally 2 (two) times a week.  Dispense: 8 each; Refill: 11   Follow up 3 months  Jiayi Lengacher B WHNP-BC 11:45 AM 05/20/2023

## 2023-06-24 ENCOUNTER — Ambulatory Visit: Payer: BC Managed Care – PPO | Admitting: Radiology

## 2023-06-24 ENCOUNTER — Encounter: Payer: Self-pay | Admitting: Radiology

## 2023-06-24 VITALS — BP 110/72 | HR 54

## 2023-06-24 DIAGNOSIS — Z780 Asymptomatic menopausal state: Secondary | ICD-10-CM | POA: Diagnosis not present

## 2023-06-24 MED ORDER — ESTRADIOL 0.05 MG/24HR TD PTTW: 1.0000 | MEDICATED_PATCH | TRANSDERMAL | 0 refills | Status: DC

## 2023-06-24 NOTE — Progress Notes (Signed)
      Subjective: Stacey Wagner is a 57 y.o. female here for follow up after taking ERT x 4 weeks. Gel caused rash on her legs, would like to switch to patch. Otherwise doing well with ERT and imvexxy thus far. No other concerns.    Review of Systems  All other systems reviewed and are negative.   History reviewed. No pertinent past medical history.    Objective:  Today's Vitals   06/24/23 0837  BP: 110/72  Pulse: (!) 54  SpO2: 99%   There is no height or weight on file to calculate BMI.   Physical Exam Vitals and nursing note reviewed.  Constitutional:      Appearance: Normal appearance. She is normal weight.  Pulmonary:     Effort: Pulmonary effort is normal.  Skin:    General: Skin is warm and dry.  Neurological:     Mental Status: She is alert.      Assessment:/Plan:  1. Menopause Will switch to patch to avoid skin irritation Continue imvexxy If libido still low in 3 months will check testosterone levels - estradiol (VIVELLE-DOT) 0.05 MG/24HR patch; Place 1 patch (0.05 mg total) onto the skin 2 (two) times a week.  Dispense: 24 patch; Refill: 0

## 2023-08-08 ENCOUNTER — Other Ambulatory Visit: Payer: Self-pay

## 2023-08-08 DIAGNOSIS — Z780 Asymptomatic menopausal state: Secondary | ICD-10-CM

## 2023-08-08 NOTE — Telephone Encounter (Signed)
Per OV notes in 8/24 pt was told to follow up in 3 months, needs office visit. May refill #8 and schedule visit

## 2023-08-08 NOTE — Telephone Encounter (Signed)
Medication refill request: vivelle dot patch 0.05mg   Last OV 06-24-23 Next AEX: not scheduled, f/u 09-30-23 Last MMG (if hormonal medication request): 07-02-22 bilateral & left breast u/s birads 1:neg Refill authorized: rx was sent 06-24-23 #24 with 0 refills. Should not need a refill. Please deny if appropriate

## 2023-08-24 ENCOUNTER — Other Ambulatory Visit: Payer: Self-pay | Admitting: Family Medicine

## 2023-08-24 DIAGNOSIS — Z1231 Encounter for screening mammogram for malignant neoplasm of breast: Secondary | ICD-10-CM

## 2023-09-30 ENCOUNTER — Ambulatory Visit: Payer: BC Managed Care – PPO | Admitting: Radiology

## 2023-09-30 ENCOUNTER — Ambulatory Visit
Admission: RE | Admit: 2023-09-30 | Discharge: 2023-09-30 | Disposition: A | Discharge status: Home / Self Care | Payer: BC Managed Care – PPO | Source: Ambulatory Visit | Attending: Family Medicine | Admitting: Family Medicine

## 2023-09-30 VITALS — BP 122/76 | HR 119 | Wt 141.0 lb

## 2023-09-30 DIAGNOSIS — R6882 Decreased libido: Secondary | ICD-10-CM | POA: Diagnosis not present

## 2023-09-30 DIAGNOSIS — N951 Menopausal and female climacteric states: Secondary | ICD-10-CM

## 2023-09-30 DIAGNOSIS — Z780 Asymptomatic menopausal state: Secondary | ICD-10-CM

## 2023-09-30 DIAGNOSIS — Z1231 Encounter for screening mammogram for malignant neoplasm of breast: Secondary | ICD-10-CM

## 2023-09-30 MED ORDER — ESTRADIOL 0.075 MG/24HR TD PTTW
1.0000 | MEDICATED_PATCH | TRANSDERMAL | 6 refills | Status: DC
Start: 2023-10-03 — End: 2024-07-06

## 2023-09-30 NOTE — Progress Notes (Signed)
      Subjective: Stacey Wagner is a 57 y.o. female here for follow up after taking ERT x 4 months. Gel caused rash on her legs, so she was switched to patch. Doing well with ERT and imvexxy thus far, still some vulvar dryness/occasional itching and scant hot flashes. Energy is much better. Libido still low, interested in testing testosterone levels.  No past medical history on file.  Allergies  Allergen Reactions   Cephalexin     rash and hives   Penicillins     Rash and hives      Review of Systems  All other systems reviewed and are negative.   No past medical history on file.    Objective:  Today's Vitals   09/30/23 0839  BP: 122/76  Pulse: (!) 119  SpO2: 90%  Weight: 141 lb (64 kg)    Body mass index is 27.54 kg/m.   Physical Exam Vitals and nursing note reviewed.  Constitutional:      Appearance: Normal appearance. She is normal weight.  Pulmonary:     Effort: Pulmonary effort is normal.  Skin:    General: Skin is warm and dry.  Neurological:     Mental Status: She is alert.      Assessment:/Plan:  1. Menopause (Primary) Will increase in hopes hot flashes will stop - estradiol (VIVELLE-DOT) 0.075 MG/24HR; Place 1 patch onto the skin 2 (two) times a week.  Dispense: 8 patch; Refill: 6  2. Low libido - Testos,Total,Free and SHBG (Female)     Arlie Solomons, [facility] 09/30/23

## 2023-10-07 ENCOUNTER — Telehealth: Payer: Self-pay

## 2023-10-07 DIAGNOSIS — R6882 Decreased libido: Secondary | ICD-10-CM

## 2023-10-07 LAB — TESTOS,TOTAL,FREE AND SHBG (FEMALE)
Free Testosterone: 2.6 pg/mL (ref 0.1–6.4)
Sex Hormone Binding: 55.9 nmol/L (ref 14–73)
Testosterone, Total, LC-MS-MS: 20 ng/dL (ref 2–45)

## 2023-10-07 MED ORDER — NONFORMULARY OR COMPOUNDED ITEM: 5 refills | Status: AC

## 2023-10-07 NOTE — Telephone Encounter (Signed)
RX called in to [facility] for Testosterone PLO gel or Anhydrous versabase 4 %, apply pea sized amount to inner thigh at bedtime.  30 gram with 5 refills per Jami. Patient notified.

## 2023-10-07 NOTE — Telephone Encounter (Signed)
Stacey Wagner Can you please call the testosterone PLO gel into [facility]? Thanks

## 2024-04-13 ENCOUNTER — Other Ambulatory Visit: Payer: Self-pay | Admitting: Radiology

## 2024-04-13 DIAGNOSIS — Z780 Asymptomatic menopausal state: Secondary | ICD-10-CM

## 2024-04-16 NOTE — Telephone Encounter (Signed)
 Med refill request: estradiol  patch Last AEX: 09/22/2018 TFontaine Next AEX: not scheduled, sent message to front desk to schedule annual visit Last MMG (if hormonal med) 09/30/23 Refill authorized: Last Rx sent #8 with 6 refills on 10/03/23 JC. Please approve or deny.

## 2024-05-03 ENCOUNTER — Other Ambulatory Visit: Payer: Self-pay | Admitting: Radiology

## 2024-05-03 DIAGNOSIS — N952 Postmenopausal atrophic vaginitis: Secondary | ICD-10-CM

## 2024-05-03 NOTE — Telephone Encounter (Signed)
 Med refill request: estradiol  10 mcg inserts Last OV: 09/30/23 Last AEX: none at [facility] Next AEX: none scheduled Last MMG (if hormonal med) 09/30/23 BI-RADS 1 negative Refill authorized: Please Advise?

## 2024-06-27 ENCOUNTER — Ambulatory Visit: Admitting: Radiology

## 2024-07-06 ENCOUNTER — Encounter: Payer: Self-pay | Admitting: Radiology

## 2024-07-06 ENCOUNTER — Ambulatory Visit (INDEPENDENT_AMBULATORY_CARE_PROVIDER_SITE_OTHER): Admitting: Radiology

## 2024-07-06 VITALS — BP 122/70 | HR 76 | Ht 60.5 in | Wt 126.0 lb

## 2024-07-06 DIAGNOSIS — Z79890 Hormone replacement therapy (postmenopausal): Secondary | ICD-10-CM | POA: Diagnosis not present

## 2024-07-06 DIAGNOSIS — Z1331 Encounter for screening for depression: Secondary | ICD-10-CM

## 2024-07-06 DIAGNOSIS — Z780 Asymptomatic menopausal state: Secondary | ICD-10-CM

## 2024-07-06 DIAGNOSIS — N952 Postmenopausal atrophic vaginitis: Secondary | ICD-10-CM

## 2024-07-06 DIAGNOSIS — R7989 Other specified abnormal findings of blood chemistry: Secondary | ICD-10-CM

## 2024-07-06 DIAGNOSIS — Z01419 Encounter for gynecological examination (general) (routine) without abnormal findings: Secondary | ICD-10-CM | POA: Diagnosis not present

## 2024-07-06 MED ORDER — ESTRADIOL 0.075 MG/24HR TD PTTW: 1.0000 | MEDICATED_PATCH | TRANSDERMAL | 6 refills | Status: AC

## 2024-07-06 MED ORDER — NONFORMULARY OR COMPOUNDED ITEM: 5 refills | Status: AC

## 2024-07-06 MED ORDER — IMVEXXY MAINTENANCE PACK 10 MCG VA INST: 1.0000 | VAGINAL_INSERT | VAGINAL | 4 refills | Status: AC

## 2024-07-06 NOTE — Progress Notes (Signed)
   Stacey Wagner Nov 04, 1965 [id number]   History:  58 y.o. G2P2 presents for annual exam. Ran out of HRT last month, symptoms are coming back. Needs refills. Has lost some weight with diet and exercise, cholesterol has also come down.  Gynecologic History Hysterectomy  Sexually active  Health Maintenance Last Pap: 2019. Results were: normal Last mammogram: 09/30/23. Results were: normal Last colonoscopy: 2024 per pt      07/06/2024    1:42 PM  Depression screen PHQ 2/9  Decreased Interest 0  PHQ - 2 Score 0     Past medical history, past surgical history, family history and social history were all reviewed and documented in the EPIC chart.  ROS:  A ROS was performed and pertinent positives and negatives are included.  Exam:  Vitals:   07/06/24 1338  BP: 122/70  Pulse: 76  SpO2: 99%  Weight: 126 lb (57.2 kg)  Height: 5' 0.5 (1.537 m)   Body mass index is 24.2 kg/m.  General appearance:  Normal Thyroid :  Symmetrical, normal in size, without palpable masses or nodularity. Respiratory  Auscultation:  Clear without wheezing or rhonchi Cardiovascular  Auscultation:  Regular rate, without rubs, murmurs or gallops  Edema/varicosities:  Not grossly evident Abdominal  Soft,nontender, without masses, guarding or rebound.  Liver/spleen:  No organomegaly noted  Hernia:  None appreciated  Skin  Inspection:  Grossly normal Breasts: Examined lying and sitting.   Right: Without masses, retractions, nipple discharge or axillary adenopathy.   Left: Without masses, retractions, nipple discharge or axillary adenopathy. Genitourinary   Inguinal/mons:  Normal without inguinal adenopathy  External genitalia:  Normal appearing vulva with no masses, tenderness, or lesions  BUS/Urethra/Skene's glands:  Normal  Vagina:  Normal appearing with normal color and discharge, no lesions. Atrophy mild  Cervix:  absent  Uterus:  absent  Adnexa/parametria:     Rt: Normal in size, without  masses or tenderness.   Lt: Normal in size, without masses or tenderness.  Anus and perineum: Normal   Darice Hoit, CMA present for exam  Assessment/Plan:   1. Well woman exam with routine gynecological exam (Primary) Mammo yearly  2. Postmenopausal HRT (hormone replacement therapy) - NONFORMULARY OR COMPOUNDED ITEM; Compounded testosterone 4% gel, apply pea sized amount to inner thigh at bedtime  Dispense: 30 each; Refill: 5 - estradiol  (VIVELLE -DOT) 0.075 MG/24HR; Place 1 patch onto the skin 2 (two) times a week.  Dispense: 8 patch; Refill: 6 - estradiol  (VIVELLE -DOT) 0.075 MG/24HR; Place 1 patch onto the skin 2 (two) times a week.  Dispense: 8 patch; Refill: 6   3. Depression screening   Return in 1 year for annual or sooner prn.  GINETTE COZIER B WHNP-BC 1:47 PM 07/06/2024

## 2024-08-06 DIAGNOSIS — N898 Other specified noninflammatory disorders of vagina: Secondary | ICD-10-CM

## 2024-08-07 NOTE — Telephone Encounter (Signed)
 We can try Diflucan  150mg  x1. If symptoms don't improve I recommend OV.
